# Patient Record
Sex: Male | Born: 1995 | Race: White | Hispanic: No | Marital: Single | Smoking: Never smoker
Health system: Southern US, Community
[De-identification: ages and names within clinical notes are randomized; demographics above are authoritative.]

## PROBLEM LIST (undated history)

## (undated) DIAGNOSIS — F909 Attention-deficit hyperactivity disorder, unspecified type: Secondary | ICD-10-CM

---
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
## 2017-12-20 ENCOUNTER — Other Ambulatory Visit: Payer: Self-pay

## 2017-12-20 ENCOUNTER — Emergency Department (HOSPITAL_COMMUNITY)
Admission: EM | Admit: 2017-12-20 | Discharge: 2017-12-21 | Disposition: A | Discharge status: Home / Self Care | Payer: Self-pay | Attending: Emergency Medicine | Admitting: Emergency Medicine

## 2017-12-20 ENCOUNTER — Encounter (HOSPITAL_COMMUNITY): Payer: Self-pay | Admitting: *Deleted

## 2017-12-20 DIAGNOSIS — B882 Other arthropod infestations: Secondary | ICD-10-CM

## 2017-12-20 DIAGNOSIS — W57XXXA Bitten or stung by nonvenomous insect and other nonvenomous arthropods, initial encounter: Secondary | ICD-10-CM | POA: Insufficient documentation

## 2017-12-20 DIAGNOSIS — Y939 Activity, unspecified: Secondary | ICD-10-CM | POA: Insufficient documentation

## 2017-12-20 DIAGNOSIS — M791 Myalgia, unspecified site: Secondary | ICD-10-CM | POA: Insufficient documentation

## 2017-12-20 DIAGNOSIS — F17290 Nicotine dependence, other tobacco product, uncomplicated: Secondary | ICD-10-CM | POA: Insufficient documentation

## 2017-12-20 DIAGNOSIS — S30863A Insect bite (nonvenomous) of scrotum and testes, initial encounter: Secondary | ICD-10-CM | POA: Insufficient documentation

## 2017-12-20 DIAGNOSIS — S80869A Insect bite (nonvenomous), unspecified lower leg, initial encounter: Secondary | ICD-10-CM | POA: Insufficient documentation

## 2017-12-20 DIAGNOSIS — S40869A Insect bite (nonvenomous) of unspecified upper arm, initial encounter: Secondary | ICD-10-CM | POA: Insufficient documentation

## 2017-12-20 DIAGNOSIS — Y998 Other external cause status: Secondary | ICD-10-CM | POA: Insufficient documentation

## 2017-12-20 DIAGNOSIS — Y929 Unspecified place or not applicable: Secondary | ICD-10-CM | POA: Insufficient documentation

## 2017-12-20 DIAGNOSIS — A681 Tick-borne relapsing fever: Secondary | ICD-10-CM | POA: Insufficient documentation

## 2017-12-20 HISTORY — DX: Attention-deficit hyperactivity disorder, unspecified type: F90.9

## 2017-12-20 NOTE — ED Triage Notes (Signed)
Pt states he has noticed bumps on his genital area for the last 4 months; pt states the bumps come and go; pt states he feels sore all over;

## 2017-12-21 MED ORDER — DOXYCYCLINE HYCLATE 100 MG PO CAPS: 100.0000 mg | ORAL_CAPSULE | Freq: Two times a day (BID) | ORAL | 0 refills | Status: DC

## 2017-12-21 MED ORDER — DOXYCYCLINE HYCLATE 100 MG PO TABS
100.0000 mg | ORAL_TABLET | Freq: Once | ORAL | Status: AC
  Administered 2017-12-21: 100 mg via ORAL
  Filled 2017-12-21: qty 1

## 2017-12-21 NOTE — Discharge Instructions (Addendum)
Take the antibiotics until gone. Your body aches and tick exposure make it likely you are having a tick born illness. You have bug bites on your arms and legs, a few of the lesions on your scrotum look like bug bites. You don't have any obvious lesions to suggest herpes. You can go to the Health department to be tested for STD's.

## 2017-12-21 NOTE — ED Provider Notes (Signed)
[facility] EMERGENCY DEPARTMENT Provider Note   CSN: [id number] Arrival date & time: 12/20/17  2100  Time seen 23:43 PM    History   Chief Complaint Chief Complaint  Patient presents with  . SEXUALLY TRANSMITTED DISEASE    HPI Bobby Weaver is a 22 y.o. male.  HPI patient is here with his sexual partner.  He states he started getting bumps on his genitals 4 months ago.  He states it comes and goes and last about a month.  The lesions are mainly on his scrotum.  He states are painful and sometimes itchy.  Sometimes a drain.  Some are red.  He denies ulcers.  He denies a penile drip or dysuria.  He denies any open sores.  His sexual partner states she does not have any genital lesions.  He states he is never been treated for an STD before.  He then tells me he was [place] in [place] the first time about this a year and a half ago for the same lesions in his groin.  He states he tested negative for STDs at that time.  He states they thought maybe he had genital warts but they were not sure.  Patient states he has had body aches for the past 6 weeks and feels fatigued.  [place] and he has had tick bites.  He also started a new job a few weeks ago and states he works 12 hours a day and he feels very fatigued.  Patient states he works for a [employer], early [employer] company.  PCP Merlyn AlbertLuking, William S, MD   Past Medical History:  Diagnosis Date  . ADHD     There are no active problems to display for this patient.   History reviewed. No pertinent surgical history.      Home Medications    None  Prior to Admission medications   Medication Sig Start Date End Date Taking? Authorizing Provider  doxycycline (VIBRAMYCIN) 100 MG capsule Take 1 capsule (100 mg total) by mouth 2 (two) times daily. 12/21/17   Devoria AlbeKnapp, Angelyna Henderson, MD  famotidine (PEPCID) 20 MG tablet Take 1 tablet (20 mg total) by mouth daily. 11/28/12   Devoria AlbeKnapp, Annistyn Depass, MD  famotidine (PEPCID) 40 MG tablet Take 0.5 tablets  (20 mg total) by mouth 2 (two) times daily. 11/28/12   Devoria AlbeKnapp, Shreena Baines, MD  famotidine (PEPCID) 40 MG tablet Take 1 tablet (40 mg total) by mouth daily. 11/28/12   Devoria AlbeKnapp, Trini Christiansen, MD  predniSONE (DELTASONE) 20 MG tablet Take 3 po QD x 2d starting Wednesday evening, then 2 po QD x 3d then 1 po QD x 3d 11/28/12   Devoria AlbeKnapp, Jolanta Cabeza, MD  predniSONE (DELTASONE) 20 MG tablet Take 3 po QD x 2d starting Wednesday evening, then 2 po QD x 3d then 1 po QD x 3d 11/28/12   Devoria AlbeKnapp, Tel Hevia, MD  predniSONE (DELTASONE) 20 MG tablet Take 3 po QD x 2d starting Wednesday evening, then 2 po QD x 3d then 1 po QD x 3d 11/28/12   Devoria AlbeKnapp, Esa Raden, MD    Family History History reviewed. No pertinent family history.  Social History Social History   Tobacco Use  . Smoking status: Current Some Day Smoker    Packs/day: 0.30  . Smokeless tobacco: Current User    Types: Snuff  Substance Use Topics  . Alcohol use: Yes    Comment: ocassionally  . Drug use: Yes    Types: Marijuana  employed  Allergies   Patient has no known allergies.   Review of Systems Review of Systems  All other systems reviewed and are negative.    Physical Exam Updated Vital Signs BP (!) 112/98 (BP Location: Right Arm)   Pulse 66   Temp 97.7 F (36.5 C) (Oral)   Resp 17   Ht 5\' 9"  (1.753 m)   Wt 65.8 kg (145 lb)   SpO2 99%   BMI 21.41 kg/m   Vital signs normal    Physical Exam  Constitutional: He is oriented to person, place, and time. He appears well-developed and well-nourished. No distress.  HENT:  Head: Normocephalic and atraumatic.  Right Ear: External ear normal.  Left Ear: External ear normal.  Nose: Nose normal.  Eyes: Conjunctivae and EOM are normal.  Neck: Neck supple.  Cardiovascular: Normal rate.  Pulmonary/Chest: Effort normal. No respiratory distress.  Musculoskeletal: Normal range of motion. He exhibits no deformity.  Neurological: He is alert and oriented to person, place, and time. No cranial nerve deficit.  Skin:  Patient is  noted to have scattered lesions on his distal arms and legs that are consistent with bug bites.  He has a few lesions around his waistband.  There are no lesions in the webspaces of his fingers.  When I examine his groin he has 2 or 3 lesions on his scrotum that look like bug bites that are similar to what he has on his arms and legs.  There is one clear fluid-filled blister that is less than 1/2 cm in size.  He has no lesions on his foreskin.  Nursing note and vitals reviewed.    ED Treatments / Results  Labs (all labs ordered are listed, but only abnormal results are displayed) Labs Reviewed - No data to display  EKG None  Radiology No results found.  Procedures Procedures (including critical care time)  Medications Ordered in ED Medications  doxycycline (VIBRA-TABS) tablet 100 mg (100 mg Oral Given 12/21/17 0011)     Initial Impression / Assessment and Plan / ED Course  I have reviewed the triage vital signs and the nursing notes.  Pertinent labs & imaging results that were available during my care of the patient were reviewed by me and considered in my medical decision making (see chart for details).     I talked to the patient about being tested for STDs, he is questioning how much it will cost.  I suggested he try the health department which would be much cheaper than the hospital or possibly even free.  He has elected to do that.  Due to his exposure of tics and his complaints of having fatigue and body aches he was started on doxycycline for 2 weeks for presumed tickborne illness.    Final Clinical Impressions(s) / ED Diagnoses   Final diagnoses:  Myalgia  Tick-borne disease  Insect bite, unspecified site, initial encounter    ED Discharge Orders        Ordered    doxycycline (VIBRAMYCIN) 100 MG capsule  2 times daily     12/21/17 0005     Plan discharge  Devoria Albe, MD, Concha Pyo, MD 12/21/17 [phone]

## 2018-09-19 ENCOUNTER — Other Ambulatory Visit: Payer: Self-pay

## 2018-09-19 ENCOUNTER — Emergency Department (HOSPITAL_COMMUNITY)
Admission: EM | Admit: 2018-09-19 | Discharge: 2018-09-19 | Disposition: A | Discharge status: Home / Self Care | Payer: BLUE CROSS/BLUE SHIELD | Attending: Emergency Medicine | Admitting: Emergency Medicine

## 2018-09-19 ENCOUNTER — Encounter (HOSPITAL_COMMUNITY): Payer: Self-pay | Admitting: Emergency Medicine

## 2018-09-19 ENCOUNTER — Emergency Department (HOSPITAL_COMMUNITY): Payer: BLUE CROSS/BLUE SHIELD

## 2018-09-19 DIAGNOSIS — Z23 Encounter for immunization: Secondary | ICD-10-CM | POA: Diagnosis not present

## 2018-09-19 DIAGNOSIS — Y9289 Other specified places as the place of occurrence of the external cause: Secondary | ICD-10-CM | POA: Insufficient documentation

## 2018-09-19 DIAGNOSIS — T148XXA Other injury of unspecified body region, initial encounter: Secondary | ICD-10-CM

## 2018-09-19 DIAGNOSIS — Y998 Other external cause status: Secondary | ICD-10-CM | POA: Diagnosis not present

## 2018-09-19 DIAGNOSIS — W450XXA Nail entering through skin, initial encounter: Secondary | ICD-10-CM | POA: Insufficient documentation

## 2018-09-19 DIAGNOSIS — Y9301 Activity, walking, marching and hiking: Secondary | ICD-10-CM | POA: Diagnosis not present

## 2018-09-19 DIAGNOSIS — S91331A Puncture wound without foreign body, right foot, initial encounter: Secondary | ICD-10-CM | POA: Diagnosis present

## 2018-09-19 MED ORDER — AMOXICILLIN-POT CLAVULANATE 875-125 MG PO TABS: 1.0000 | ORAL_TABLET | Freq: Two times a day (BID) | ORAL | 0 refills | Status: AC

## 2018-09-19 MED ORDER — TETANUS-DIPHTH-ACELL PERTUSSIS 5-2.5-18.5 LF-MCG/0.5 IM SUSP
0.5000 mL | Freq: Once | INTRAMUSCULAR | Status: AC
  Administered 2018-09-19: 15:00:00 0.5 mL via INTRAMUSCULAR
  Filled 2018-09-19: qty 0.5

## 2018-09-19 NOTE — Discharge Instructions (Addendum)
To help with pain in your right foot, we are prescribing an antibiotic in case you have an infection.  Also it helps to soak the foot in warm water 3-4 times a day for 30 minutes.  When you are not walking, try to elevate your foot above your heart is much as possible, to help the pain.  We sent a prescription to your pharmacy.  Go there and pick it up and start taking it today.  See your doctor or return here as needed for problems.

## 2018-09-19 NOTE — ED Provider Notes (Signed)
[facility] EMERGENCY DEPARTMENT Provider Note   CSN: [id number] Arrival date & time: 09/19/18  1403    History   Chief Complaint Chief Complaint  Patient presents with  . Foot Injury    HPI Bobby Weaver is a 23 y.o. male.     HPI   He presents for evaluation of worsening foot pain after stepping on a nail, while at work, 2 days ago.  He was wearing a shoe at the time.  The pain is worse when ambulating.  He denies fever, chills, nausea, dizziness, weakness.  Unknown prior tetanus status.  There are no other known modifying factors.  Past Medical History:  Diagnosis Date  . ADHD     There are no active problems to display for this patient.   History reviewed. No pertinent surgical history.      Home Medications    Prior to Admission medications   Medication Sig Start Date End Date Taking? Authorizing Provider  amoxicillin-clavulanate (AUGMENTIN) 875-125 MG tablet Take 1 tablet by mouth 2 (two) times daily. One po bid x 7 days 09/19/18   Mancel Bale, MD    Family History History reviewed. No pertinent family history.  Social History Social History   Tobacco Use  . Smoking status: Current Some Day Smoker    Packs/day: 0.30  . Smokeless tobacco: Current User    Types: Snuff  Substance Use Topics  . Alcohol use: Yes    Comment: ocassionally  . Drug use: Yes    Types: Marijuana     Allergies   Patient has no known allergies.   Review of Systems Review of Systems  All other systems reviewed and are negative.    Physical Exam Updated Vital Signs BP 121/75 (BP Location: Right Arm)   Pulse 66   Temp 98.3 F (36.8 C) (Oral)   Resp 17   Ht 5\' 9"  (1.753 m)   Wt 68 kg   SpO2 100%   BMI 22.15 kg/m   Physical Exam Vitals signs and nursing note reviewed.  Constitutional:      Appearance: He is well-developed.  HENT:     Head: Normocephalic and atraumatic.     Right Ear: External ear normal.     Left Ear: External ear normal.  Eyes:     Conjunctiva/sclera: Conjunctivae normal.     Pupils: Pupils are equal, round, and reactive to light.  Neck:     Musculoskeletal: Normal range of motion and neck supple.     Trachea: Phonation normal.  Cardiovascular:     Rate and Rhythm: Normal rate.  Musculoskeletal: Normal range of motion.        General: No swelling.     Comments: Right heel is moderately tender, there is a small puncture, mid aspect of the plantar heel.  There is no associated bleeding, drainage or redness from this wound.  There is no change in skin color.  Skin:    General: Skin is warm and dry.  Neurological:     Mental Status: He is alert and oriented to person, place, and time.     Cranial Nerves: No cranial nerve deficit.     Sensory: No sensory deficit.     Motor: No abnormal muscle tone.     Coordination: Coordination normal.  Psychiatric:        Behavior: Behavior normal.        Thought Content: Thought content normal.        Judgment: Judgment normal.  ED Treatments / Results  Labs (all labs ordered are listed, but only abnormal results are displayed) Labs Reviewed - No data to display  EKG None  Radiology Dg Foot 2 Views Right  Result Date: 09/19/2018 CLINICAL DATA:  Puncture wound. EXAM: RIGHT FOOT - 2 VIEW COMPARISON:  None. FINDINGS: There is no evidence of fracture or dislocation. There is no evidence of arthropathy or other focal bone abnormality. Soft tissues are unremarkable. IMPRESSION: Negative. Electronically Signed   By: Elberta Fortisaniel  Boyle M.D.   On: 09/19/2018 14:53    Procedures Procedures (including critical care time)  Medications Ordered in ED Medications  Tdap (BOOSTRIX) injection 0.5 mL (0.5 mLs Intramuscular Given 09/19/18 1514)     Initial Impression / Assessment and Plan / ED Course  I have reviewed the triage vital signs and the nursing notes.  Pertinent labs & imaging results that were available during my care of the patient were reviewed by me and considered  in my medical decision making (see chart for details).  Clinical Course as of Sep 18 1513  Wed Sep 19, 2018  1504 No foreign body, or fracture, images reviewed by me  DG Foot 2 Views Right [EW]    Clinical Course User Index [EW] Mancel BaleWentz, Orton Capell, MD        Patient Vitals for the past 24 hrs:  BP Temp Temp src Pulse Resp SpO2 Height Weight  09/19/18 1418 - - - - - - 5\' 9"  (1.753 m) 68 kg  09/19/18 1417 121/75 98.3 F (36.8 C) Oral 66 17 100 % - -    3:14 PM Reevaluation with update and discussion. After initial assessment and treatment, an updated evaluation reveals he is ambulating with a limp.  He has no further complaints.  Findings discussed with the patient all questions were answered. Mancel BaleElliott Linnaea Ahn   Medical Decision Making: Puncture wound, with possible early wound infection.  No evidence for foreign body.  No ascending infection.  CRITICAL CARE-no Performed by: Mancel BaleElliott Ayanah Snader  Nursing Notes Reviewed/ Care Coordinated Applicable Imaging Reviewed Interpretation of Laboratory Data incorporated into ED treatment  The patient appears reasonably screened and/or stabilized for discharge and I doubt any other medical condition or other [facility] requiring further screening, evaluation, or treatment in the ED at this time prior to discharge.  Plan: Home Medications-OTC analgesia of choice; Home Treatments-warm soaks and elevation for comfort; return here if the recommended treatment, does not improve the symptoms; Recommended follow up-PCP, PRN   Final Clinical Impressions(s) / ED Diagnoses   Final diagnoses:  Puncture wound    ED Discharge Orders         Ordered    amoxicillin-clavulanate (AUGMENTIN) 875-125 MG tablet  2 times daily     09/19/18 1513           Mancel BaleWentz, Nadiya Pieratt, MD 09/19/18 1515

## 2018-09-19 NOTE — ED Triage Notes (Signed)
Pt stepped on a nail at work 3 days ago.  States having ankle pain with mild swelling since

## 2018-10-06 ENCOUNTER — Other Ambulatory Visit: Payer: Self-pay

## 2018-10-06 ENCOUNTER — Emergency Department (HOSPITAL_COMMUNITY): Payer: BLUE CROSS/BLUE SHIELD

## 2018-10-06 ENCOUNTER — Encounter (HOSPITAL_COMMUNITY): Payer: Self-pay | Admitting: *Deleted

## 2018-10-06 ENCOUNTER — Inpatient Hospital Stay (HOSPITAL_COMMUNITY)
Admission: EM | Admit: 2018-10-06 | Discharge: 2018-10-22 | DRG: 917 | Disposition: E | Payer: BLUE CROSS/BLUE SHIELD | Attending: Pulmonary Disease | Admitting: Pulmonary Disease

## 2018-10-06 DIAGNOSIS — E874 Mixed disorder of acid-base balance: Secondary | ICD-10-CM | POA: Diagnosis present

## 2018-10-06 DIAGNOSIS — J9602 Acute respiratory failure with hypercapnia: Secondary | ICD-10-CM | POA: Diagnosis present

## 2018-10-06 DIAGNOSIS — N179 Acute kidney failure, unspecified: Secondary | ICD-10-CM

## 2018-10-06 DIAGNOSIS — G253 Myoclonus: Secondary | ICD-10-CM | POA: Diagnosis present

## 2018-10-06 DIAGNOSIS — Z978 Presence of other specified devices: Secondary | ICD-10-CM

## 2018-10-06 DIAGNOSIS — Z20828 Contact with and (suspected) exposure to other viral communicable diseases: Secondary | ICD-10-CM | POA: Diagnosis present

## 2018-10-06 DIAGNOSIS — J9601 Acute respiratory failure with hypoxia: Secondary | ICD-10-CM | POA: Diagnosis present

## 2018-10-06 DIAGNOSIS — Y92002 Bathroom of unspecified non-institutional (private) residence single-family (private) house as the place of occurrence of the external cause: Secondary | ICD-10-CM

## 2018-10-06 DIAGNOSIS — G931 Anoxic brain damage, not elsewhere classified: Secondary | ICD-10-CM | POA: Diagnosis present

## 2018-10-06 DIAGNOSIS — A419 Sepsis, unspecified organism: Secondary | ICD-10-CM | POA: Diagnosis present

## 2018-10-06 DIAGNOSIS — J982 Interstitial emphysema: Secondary | ICD-10-CM | POA: Diagnosis not present

## 2018-10-06 DIAGNOSIS — T405X1A Poisoning by cocaine, accidental (unintentional), initial encounter: Principal | ICD-10-CM | POA: Diagnosis present

## 2018-10-06 DIAGNOSIS — T50904A Poisoning by unspecified drugs, medicaments and biological substances, undetermined, initial encounter: Secondary | ICD-10-CM

## 2018-10-06 DIAGNOSIS — K7200 Acute and subacute hepatic failure without coma: Secondary | ICD-10-CM | POA: Diagnosis present

## 2018-10-06 DIAGNOSIS — E872 Acidosis: Secondary | ICD-10-CM

## 2018-10-06 DIAGNOSIS — Z8674 Personal history of sudden cardiac arrest: Secondary | ICD-10-CM

## 2018-10-06 DIAGNOSIS — Z529 Donor of unspecified organ or tissue: Secondary | ICD-10-CM

## 2018-10-06 DIAGNOSIS — G935 Compression of brain: Secondary | ICD-10-CM | POA: Diagnosis present

## 2018-10-06 DIAGNOSIS — R6521 Severe sepsis with septic shock: Secondary | ICD-10-CM | POA: Diagnosis present

## 2018-10-06 DIAGNOSIS — R570 Cardiogenic shock: Secondary | ICD-10-CM | POA: Diagnosis present

## 2018-10-06 DIAGNOSIS — F1210 Cannabis abuse, uncomplicated: Secondary | ICD-10-CM | POA: Diagnosis present

## 2018-10-06 DIAGNOSIS — Z515 Encounter for palliative care: Secondary | ICD-10-CM

## 2018-10-06 DIAGNOSIS — I469 Cardiac arrest, cause unspecified: Secondary | ICD-10-CM

## 2018-10-06 DIAGNOSIS — G936 Cerebral edema: Secondary | ICD-10-CM | POA: Diagnosis present

## 2018-10-06 DIAGNOSIS — F1110 Opioid abuse, uncomplicated: Secondary | ICD-10-CM | POA: Diagnosis present

## 2018-10-06 DIAGNOSIS — F1510 Other stimulant abuse, uncomplicated: Secondary | ICD-10-CM | POA: Diagnosis present

## 2018-10-06 DIAGNOSIS — T50901A Poisoning by unspecified drugs, medicaments and biological substances, accidental (unintentional), initial encounter: Secondary | ICD-10-CM | POA: Diagnosis present

## 2018-10-06 DIAGNOSIS — G9382 Brain death: Secondary | ICD-10-CM | POA: Diagnosis not present

## 2018-10-06 DIAGNOSIS — Z7189 Other specified counseling: Secondary | ICD-10-CM

## 2018-10-06 DIAGNOSIS — G92 Toxic encephalopathy: Secondary | ICD-10-CM | POA: Diagnosis present

## 2018-10-06 DIAGNOSIS — I609 Nontraumatic subarachnoid hemorrhage, unspecified: Secondary | ICD-10-CM | POA: Diagnosis present

## 2018-10-06 DIAGNOSIS — N170 Acute kidney failure with tubular necrosis: Secondary | ICD-10-CM | POA: Diagnosis present

## 2018-10-06 DIAGNOSIS — J690 Pneumonitis due to inhalation of food and vomit: Secondary | ICD-10-CM | POA: Diagnosis present

## 2018-10-06 DIAGNOSIS — F1410 Cocaine abuse, uncomplicated: Secondary | ICD-10-CM | POA: Diagnosis present

## 2018-10-06 DIAGNOSIS — T407X1A Poisoning by cannabis (derivatives), accidental (unintentional), initial encounter: Secondary | ICD-10-CM | POA: Diagnosis present

## 2018-10-06 LAB — RAPID URINE DRUG SCREEN, HOSP PERFORMED
Amphetamines: POSITIVE — AB
Barbiturates: NOT DETECTED
Benzodiazepines: NOT DETECTED
Cocaine: POSITIVE — AB
Opiates: NOT DETECTED
Tetrahydrocannabinol: POSITIVE — AB

## 2018-10-06 LAB — COMPREHENSIVE METABOLIC PANEL
ALT: 116 U/L — ABNORMAL HIGH (ref 0–44)
AST: 145 U/L — ABNORMAL HIGH (ref 15–41)
Albumin: 3.1 g/dL — ABNORMAL LOW (ref 3.5–5.0)
Alkaline Phosphatase: 64 U/L (ref 38–126)
Anion gap: 19 — ABNORMAL HIGH (ref 5–15)
BUN: 13 mg/dL (ref 6–20)
CO2: 16 mmol/L — ABNORMAL LOW (ref 22–32)
Calcium: 8.2 mg/dL — ABNORMAL LOW (ref 8.9–10.3)
Chloride: 107 mmol/L (ref 98–111)
Creatinine, Ser: 2.24 mg/dL — ABNORMAL HIGH (ref 0.61–1.24)
GFR calc Af Amer: 47 mL/min — ABNORMAL LOW (ref >60)
GFR calc non Af Amer: 40 mL/min — ABNORMAL LOW (ref >60)
Glucose, Bld: 347 mg/dL — ABNORMAL HIGH (ref 70–99)
Potassium: 5.2 mmol/L — ABNORMAL HIGH (ref 3.5–5.1)
Sodium: 142 mmol/L (ref 135–145)
Total Bilirubin: 0.7 mg/dL (ref 0.3–1.2)
Total Protein: 5.4 g/dL — ABNORMAL LOW (ref 6.5–8.1)

## 2018-10-06 LAB — POCT I-STAT 7, (LYTES, BLD GAS, ICA,H+H)
Acid-base deficit: 11 mmol/L — ABNORMAL HIGH (ref 0.0–2.0)
Bicarbonate: 19.7 mmol/L — ABNORMAL LOW (ref 20.0–28.0)
Calcium, Ion: 1.08 mmol/L — ABNORMAL LOW (ref 1.15–1.40)
HCT: 41 % (ref 39.0–52.0)
Hemoglobin: 13.9 g/dL (ref 13.0–17.0)
O2 Saturation: 100 %
Potassium: 6 mmol/L — ABNORMAL HIGH (ref 3.5–5.1)
Sodium: 138 mmol/L (ref 135–145)
TCO2: 22 mmol/L (ref 22–32)
pCO2 arterial: 67.1 mmHg (ref 32.0–48.0)
pH, Arterial: 7.075 — CL (ref 7.350–7.450)
pO2, Arterial: 578 mmHg — ABNORMAL HIGH (ref 83.0–108.0)

## 2018-10-06 LAB — POCT I-STAT EG7
Acid-base deficit: 17 mmol/L — ABNORMAL HIGH (ref 0.0–2.0)
Bicarbonate: 15.3 mmol/L — ABNORMAL LOW (ref 20.0–28.0)
Calcium, Ion: 1.02 mmol/L — ABNORMAL LOW (ref 1.15–1.40)
HCT: 39 % (ref 39.0–52.0)
Hemoglobin: 13.3 g/dL (ref 13.0–17.0)
O2 Saturation: 98 %
Potassium: 4.9 mmol/L (ref 3.5–5.1)
Sodium: 138 mmol/L (ref 135–145)
TCO2: 17 mmol/L — ABNORMAL LOW (ref 22–32)
pCO2, Ven: 69.6 mmHg — ABNORMAL HIGH (ref 44.0–60.0)
pH, Ven: 6.951 — CL (ref 7.250–7.430)
pO2, Ven: 170 mmHg — ABNORMAL HIGH (ref 32.0–45.0)

## 2018-10-06 LAB — SALICYLATE LEVEL: Salicylate Lvl: <7 mg/dL (ref 2.8–30.0)

## 2018-10-06 LAB — ACETAMINOPHEN LEVEL: Acetaminophen (Tylenol), Serum: <10 ug/mL — ABNORMAL LOW (ref 10–30)

## 2018-10-06 LAB — ETHANOL: Alcohol, Ethyl (B): <10 mg/dL (ref <10)

## 2018-10-06 LAB — LACTIC ACID, PLASMA: Lactic Acid, Venous: 9 mmol/L (ref 0.5–1.9)

## 2018-10-06 MED ORDER — FENTANYL 2500MCG IN NS 250ML (10MCG/ML) PREMIX INFUSION
40.0000 ug/h | INTRAVENOUS | Status: DC
  Administered 2018-10-07: 40 ug/h via INTRAVENOUS
  Filled 2018-10-06: qty 250

## 2018-10-06 MED ORDER — SODIUM CHLORIDE 0.9 % IV BOLUS
1000.0000 mL | Freq: Once | INTRAVENOUS | Status: AC
  Administered 2018-10-06: 1000 mL via INTRAVENOUS

## 2018-10-06 MED ORDER — FENTANYL CITRATE (PF) 100 MCG/2ML IJ SOLN
100.0000 ug | Freq: Once | INTRAMUSCULAR | Status: AC
  Administered 2018-10-06: 100 ug via INTRAVENOUS
  Filled 2018-10-06: qty 2

## 2018-10-06 NOTE — Progress Notes (Signed)
   10-19-18 2246  Clinical Encounter Type  Visited With Health care provider  Visit Type Initial  Referral From Nurse   Chaplain was paged for patient who is post-CPR. Family is in Consultation A. Chaplain was advised by RN to be on standby until the MD is ready to update the family. Chaplain will seek to follow-up.   Spiritual care services available as needed.   Chaplain Dalbert Garnet

## 2018-10-06 NOTE — ED Notes (Signed)
bair hugger applied.

## 2018-10-06 NOTE — ED Notes (Signed)
bair hugger placed on the pt as soon as he arrived after the rectal temp

## 2018-10-06 NOTE — ED Notes (Signed)
Pts family members in consultation room A

## 2018-10-06 NOTE — ED Triage Notes (Signed)
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

## 2018-10-06 NOTE — ED Notes (Signed)
To c-t and back  He has some head motion both pupils remains fixed and dilated

## 2018-10-06 NOTE — Progress Notes (Signed)
ABG collected  

## 2018-10-06 NOTE — Progress Notes (Signed)
Patient transported from trauma A to CT and back with no complications.

## 2018-10-07 ENCOUNTER — Inpatient Hospital Stay (HOSPITAL_COMMUNITY): Payer: BLUE CROSS/BLUE SHIELD

## 2018-10-07 ENCOUNTER — Inpatient Hospital Stay: Payer: Self-pay

## 2018-10-07 DIAGNOSIS — E872 Acidosis, unspecified: Secondary | ICD-10-CM | POA: Insufficient documentation

## 2018-10-07 DIAGNOSIS — G931 Anoxic brain damage, not elsewhere classified: Secondary | ICD-10-CM

## 2018-10-07 DIAGNOSIS — J9602 Acute respiratory failure with hypercapnia: Secondary | ICD-10-CM

## 2018-10-07 DIAGNOSIS — Z978 Presence of other specified devices: Secondary | ICD-10-CM | POA: Insufficient documentation

## 2018-10-07 DIAGNOSIS — Z515 Encounter for palliative care: Secondary | ICD-10-CM

## 2018-10-07 DIAGNOSIS — G9340 Encephalopathy, unspecified: Secondary | ICD-10-CM

## 2018-10-07 DIAGNOSIS — J9601 Acute respiratory failure with hypoxia: Secondary | ICD-10-CM

## 2018-10-07 DIAGNOSIS — T50901A Poisoning by unspecified drugs, medicaments and biological substances, accidental (unintentional), initial encounter: Secondary | ICD-10-CM | POA: Diagnosis present

## 2018-10-07 DIAGNOSIS — I469 Cardiac arrest, cause unspecified: Secondary | ICD-10-CM | POA: Insufficient documentation

## 2018-10-07 LAB — BASIC METABOLIC PANEL
Anion gap: 22 — ABNORMAL HIGH (ref 5–15)
Anion gap: 19 — ABNORMAL HIGH (ref 5–15)
Anion gap: 15 (ref 5–15)
BUN: 20 mg/dL (ref 6–20)
BUN: 23 mg/dL — ABNORMAL HIGH (ref 6–20)
BUN: 31 mg/dL — ABNORMAL HIGH (ref 6–20)
CO2: 21 mmol/L — ABNORMAL LOW (ref 22–32)
CO2: 26 mmol/L (ref 22–32)
CO2: 31 mmol/L (ref 22–32)
Calcium: 7.6 mg/dL — ABNORMAL LOW (ref 8.9–10.3)
Calcium: 7.4 mg/dL — ABNORMAL LOW (ref 8.9–10.3)
Calcium: 6 mg/dL — CL (ref 8.9–10.3)
Chloride: 108 mmol/L (ref 98–111)
Chloride: 105 mmol/L (ref 98–111)
Chloride: 100 mmol/L (ref 98–111)
Creatinine, Ser: 2.61 mg/dL — ABNORMAL HIGH (ref 0.61–1.24)
Creatinine, Ser: 2.56 mg/dL — ABNORMAL HIGH (ref 0.61–1.24)
Creatinine, Ser: 2.64 mg/dL — ABNORMAL HIGH (ref 0.61–1.24)
GFR calc Af Amer: 39 mL/min — ABNORMAL LOW (ref >60)
GFR calc Af Amer: 40 mL/min — ABNORMAL LOW (ref >60)
GFR calc Af Amer: 38 mL/min — ABNORMAL LOW (ref >60)
GFR calc non Af Amer: 34 mL/min — ABNORMAL LOW (ref >60)
GFR calc non Af Amer: 34 mL/min — ABNORMAL LOW (ref >60)
GFR calc non Af Amer: 33 mL/min — ABNORMAL LOW (ref >60)
Glucose, Bld: 101 mg/dL — ABNORMAL HIGH (ref 70–99)
Glucose, Bld: 98 mg/dL (ref 70–99)
Glucose, Bld: 105 mg/dL — ABNORMAL HIGH (ref 70–99)
Potassium: 4.4 mmol/L (ref 3.5–5.1)
Potassium: 3.8 mmol/L (ref 3.5–5.1)
Potassium: 4.5 mmol/L (ref 3.5–5.1)
Sodium: 151 mmol/L — ABNORMAL HIGH (ref 135–145)
Sodium: 150 mmol/L — ABNORMAL HIGH (ref 135–145)
Sodium: 146 mmol/L — ABNORMAL HIGH (ref 135–145)

## 2018-10-07 LAB — CBC WITH DIFFERENTIAL/PLATELET
Abs Immature Granulocytes: 0.72 10*3/uL — ABNORMAL HIGH (ref 0.00–0.07)
Abs Immature Granulocytes: 0.05 10*3/uL (ref 0.00–0.07)
Basophils Absolute: 0.1 10*3/uL (ref 0.0–0.1)
Basophils Absolute: 0.1 10*3/uL (ref 0.0–0.1)
Basophils Relative: 1 %
Basophils Relative: 1 %
Eosinophils Absolute: 0.2 10*3/uL (ref 0.0–0.5)
Eosinophils Absolute: 0 10*3/uL (ref 0.0–0.5)
Eosinophils Relative: 2 %
Eosinophils Relative: 0 %
HCT: 42.8 % (ref 39.0–52.0)
HCT: 59.4 % — ABNORMAL HIGH (ref 39.0–52.0)
Hemoglobin: 13.3 g/dL (ref 13.0–17.0)
Hemoglobin: 19.3 g/dL — ABNORMAL HIGH (ref 13.0–17.0)
Immature Granulocytes: 5 %
Immature Granulocytes: 1 %
Lymphocytes Relative: 49 %
Lymphocytes Relative: 12 %
Lymphs Abs: 7.2 10*3/uL — ABNORMAL HIGH (ref 0.7–4.0)
Lymphs Abs: 0.8 10*3/uL (ref 0.7–4.0)
MCH: 30.7 pg (ref 26.0–34.0)
MCH: 30.7 pg (ref 26.0–34.0)
MCHC: 31.1 g/dL (ref 30.0–36.0)
MCHC: 32.5 g/dL (ref 30.0–36.0)
MCV: 98.8 fL (ref 80.0–100.0)
MCV: 94.6 fL (ref 80.0–100.0)
Monocytes Absolute: 0.7 10*3/uL (ref 0.1–1.0)
Monocytes Absolute: 0.9 10*3/uL (ref 0.1–1.0)
Monocytes Relative: 5 %
Monocytes Relative: 13 %
Neutro Abs: 5.6 10*3/uL (ref 1.7–7.7)
Neutro Abs: 4.7 10*3/uL (ref 1.7–7.7)
Neutrophils Relative %: 38 %
Neutrophils Relative %: 73 %
Platelets: 377 10*3/uL (ref 150–400)
Platelets: 273 10*3/uL (ref 150–400)
RBC: 4.33 MIL/uL (ref 4.22–5.81)
RBC: 6.28 MIL/uL — ABNORMAL HIGH (ref 4.22–5.81)
RDW: 12.6 % (ref 11.5–15.5)
RDW: 12.9 % (ref 11.5–15.5)
WBC: 14.5 10*3/uL — ABNORMAL HIGH (ref 4.0–10.5)
WBC: 6.5 10*3/uL (ref 4.0–10.5)
nRBC: 0 % (ref 0.0–0.2)
nRBC: 0 % (ref 0.0–0.2)

## 2018-10-07 LAB — POCT I-STAT 7, (LYTES, BLD GAS, ICA,H+H)
Acid-base deficit: 16 mmol/L — ABNORMAL HIGH (ref 0.0–2.0)
Acid-base deficit: 16 mmol/L — ABNORMAL HIGH (ref 0.0–2.0)
Acid-base deficit: 16 mmol/L — ABNORMAL HIGH (ref 0.0–2.0)
Bicarbonate: 17.8 mmol/L — ABNORMAL LOW (ref 20.0–28.0)
Bicarbonate: 14.3 mmol/L — ABNORMAL LOW (ref 20.0–28.0)
Bicarbonate: 18.4 mmol/L — ABNORMAL LOW (ref 20.0–28.0)
Calcium, Ion: 1.02 mmol/L — ABNORMAL LOW (ref 1.15–1.40)
Calcium, Ion: 1.04 mmol/L — ABNORMAL LOW (ref 1.15–1.40)
Calcium, Ion: 1.07 mmol/L — ABNORMAL LOW (ref 1.15–1.40)
HCT: 60 % — ABNORMAL HIGH (ref 39.0–52.0)
HCT: 59 % — ABNORMAL HIGH (ref 39.0–52.0)
HCT: 60 % — ABNORMAL HIGH (ref 39.0–52.0)
Hemoglobin: 20.4 g/dL — ABNORMAL HIGH (ref 13.0–17.0)
Hemoglobin: 20.1 g/dL — ABNORMAL HIGH (ref 13.0–17.0)
Hemoglobin: 20.4 g/dL — ABNORMAL HIGH (ref 13.0–17.0)
O2 Saturation: 76 %
O2 Saturation: 77 %
O2 Saturation: 86 %
Patient temperature: 96.9
Patient temperature: 96.9
Patient temperature: 96.9
Potassium: 4.7 mmol/L (ref 3.5–5.1)
Potassium: 4.8 mmol/L (ref 3.5–5.1)
Potassium: 5 mmol/L (ref 3.5–5.1)
Sodium: 142 mmol/L (ref 135–145)
Sodium: 140 mmol/L (ref 135–145)
Sodium: 141 mmol/L (ref 135–145)
TCO2: 20 mmol/L — ABNORMAL LOW (ref 22–32)
TCO2: 16 mmol/L — ABNORMAL LOW (ref 22–32)
TCO2: 21 mmol/L — ABNORMAL LOW (ref 22–32)
pCO2 arterial: 72.6 mmHg (ref 32.0–48.0)
pCO2 arterial: 46.7 mmHg (ref 32.0–48.0)
pCO2 arterial: 76.5 mmHg (ref 32.0–48.0)
pH, Arterial: 6.992 — CL (ref 7.350–7.450)
pH, Arterial: 6.982 — CL (ref 7.350–7.450)
pH, Arterial: 7.088 — CL (ref 7.350–7.450)
pO2, Arterial: 59 mmHg — ABNORMAL LOW (ref 83.0–108.0)
pO2, Arterial: 61 mmHg — ABNORMAL LOW (ref 83.0–108.0)
pO2, Arterial: 67 mmHg — ABNORMAL LOW (ref 83.0–108.0)

## 2018-10-07 LAB — SARS CORONAVIRUS 2 BY RT PCR (HOSPITAL ORDER, PERFORMED IN ~~LOC~~ HOSPITAL LAB): SARS Coronavirus 2: NEGATIVE

## 2018-10-07 LAB — GLUCOSE, CAPILLARY
Glucose-Capillary: 117 mg/dL — ABNORMAL HIGH (ref 70–99)
Glucose-Capillary: 105 mg/dL — ABNORMAL HIGH (ref 70–99)
Glucose-Capillary: 117 mg/dL — ABNORMAL HIGH (ref 70–99)
Glucose-Capillary: 89 mg/dL (ref 70–99)
Glucose-Capillary: 79 mg/dL (ref 70–99)

## 2018-10-07 LAB — COMPREHENSIVE METABOLIC PANEL
ALT: 156 U/L — ABNORMAL HIGH (ref 0–44)
AST: 213 U/L — ABNORMAL HIGH (ref 15–41)
Albumin: 3.3 g/dL — ABNORMAL LOW (ref 3.5–5.0)
Alkaline Phosphatase: 119 U/L (ref 38–126)
Anion gap: 16 — ABNORMAL HIGH (ref 5–15)
BUN: 18 mg/dL (ref 6–20)
CO2: 20 mmol/L — ABNORMAL LOW (ref 22–32)
Calcium: 7.7 mg/dL — ABNORMAL LOW (ref 8.9–10.3)
Chloride: 107 mmol/L (ref 98–111)
Creatinine, Ser: 2.92 mg/dL — ABNORMAL HIGH (ref 0.61–1.24)
GFR calc Af Amer: 34 mL/min — ABNORMAL LOW (ref >60)
GFR calc non Af Amer: 29 mL/min — ABNORMAL LOW (ref >60)
Glucose, Bld: 199 mg/dL — ABNORMAL HIGH (ref 70–99)
Potassium: 5.1 mmol/L (ref 3.5–5.1)
Sodium: 143 mmol/L (ref 135–145)
Total Bilirubin: 0.6 mg/dL (ref 0.3–1.2)
Total Protein: 6.1 g/dL — ABNORMAL LOW (ref 6.5–8.1)

## 2018-10-07 LAB — BLOOD GAS, ARTERIAL
Acid-Base Excess: 0.3 mmol/L (ref 0.0–2.0)
Acid-Base Excess: 5.9 mmol/L — ABNORMAL HIGH (ref 0.0–2.0)
Acid-base deficit: 10.7 mmol/L — ABNORMAL HIGH (ref 0.0–2.0)
Bicarbonate: 18.4 mmol/L — ABNORMAL LOW (ref 20.0–28.0)
Bicarbonate: 27.4 mmol/L (ref 20.0–28.0)
Bicarbonate: 33.5 mmol/L — ABNORMAL HIGH (ref 20.0–28.0)
Drawn by: 36277
Drawn by: 44137
Drawn by: 36277
FIO2: 100
FIO2: 100
FIO2: 100
MECHVT: 540 mL
MECHVT: 600 mL
MECHVT: 600 mL
O2 Saturation: 97.3 %
O2 Saturation: 96.8 %
O2 Saturation: 99.1 %
PEEP: 15 cmH2O
PEEP: 15 cmH2O
PEEP: 15 cmH2O
Patient temperature: 102.6
Patient temperature: 98.6
Patient temperature: 101.1
RATE: 28 resp/min
RATE: 30 resp/min
RATE: 30 resp/min
pCO2 arterial: 78.9 mmHg (ref 32.0–48.0)
pCO2 arterial: 71.3 mmHg (ref 32.0–48.0)
pCO2 arterial: 95.5 mmHg (ref 32.0–48.0)
pH, Arterial: 7.015 — CL (ref 7.350–7.450)
pH, Arterial: 7.209 — ABNORMAL LOW (ref 7.350–7.450)
pH, Arterial: 7.181 — CL (ref 7.350–7.450)
pO2, Arterial: 155 mmHg — ABNORMAL HIGH (ref 83.0–108.0)
pO2, Arterial: 98.9 mmHg (ref 83.0–108.0)
pO2, Arterial: 356 mmHg — ABNORMAL HIGH (ref 83.0–108.0)

## 2018-10-07 LAB — URINALYSIS, ROUTINE W REFLEX MICROSCOPIC
Bilirubin Urine: NEGATIVE
Glucose, UA: >=500 mg/dL — AB
Ketones, ur: NEGATIVE mg/dL
Leukocytes,Ua: NEGATIVE
Nitrite: NEGATIVE
Protein, ur: 100 mg/dL — AB
Specific Gravity, Urine: 1.013 (ref 1.005–1.030)
pH: 6 (ref 5.0–8.0)

## 2018-10-07 LAB — PHOSPHORUS
Phosphorus: >30 mg/dL — ABNORMAL HIGH (ref 2.5–4.6)
Phosphorus: 9 mg/dL — ABNORMAL HIGH (ref 2.5–4.6)

## 2018-10-07 LAB — MAGNESIUM
Magnesium: 2.8 mg/dL — ABNORMAL HIGH (ref 1.7–2.4)
Magnesium: 2.4 mg/dL (ref 1.7–2.4)

## 2018-10-07 LAB — MRSA PCR SCREENING: MRSA by PCR: NEGATIVE

## 2018-10-07 LAB — HIV ANTIBODY (ROUTINE TESTING W REFLEX): HIV Screen 4th Generation wRfx: NONREACTIVE

## 2018-10-07 LAB — TROPONIN I
Troponin I: 1.04 ng/mL (ref <0.03)
Troponin I: 0.9 ng/mL (ref <0.03)

## 2018-10-07 LAB — CORTISOL: Cortisol, Plasma: 45.3 ug/dL

## 2018-10-07 LAB — LACTIC ACID, PLASMA: Lactic Acid, Venous: 4.7 mmol/L (ref 0.5–1.9)

## 2018-10-07 LAB — CK: Total CK: 574 U/L — ABNORMAL HIGH (ref 49–397)

## 2018-10-07 MED ORDER — ONDANSETRON HCL 4 MG/2ML IJ SOLN: 4.0000 mg | Freq: Four times a day (QID) | INTRAMUSCULAR | Status: DC | PRN

## 2018-10-07 MED ORDER — CHLORHEXIDINE GLUCONATE CLOTH 2 % EX PADS
6.0000 | MEDICATED_PAD | Freq: Every day | CUTANEOUS | Status: DC
  Administered 2018-10-07 – 2018-10-09 (×3): 6 via TOPICAL

## 2018-10-07 MED ORDER — SODIUM CHLORIDE 0.9 % IV SOLN: INTRAVENOUS | Status: DC | PRN

## 2018-10-07 MED ORDER — ACETAMINOPHEN 325 MG PO TABS
650.0000 mg | ORAL_TABLET | ORAL | Status: DC | PRN
  Administered 2018-10-07: 650 mg via ORAL
  Filled 2018-10-07: qty 2

## 2018-10-07 MED ORDER — ACETAMINOPHEN 650 MG RE SUPP
650.0000 mg | Freq: Once | RECTAL | Status: AC
  Administered 2018-10-07: 650 mg via RECTAL
  Filled 2018-10-07: qty 1

## 2018-10-07 MED ORDER — SODIUM BICARBONATE 8.4 % IV SOLN
INTRAVENOUS | Status: AC
  Filled 2018-10-07: qty 100

## 2018-10-07 MED ORDER — SODIUM CHLORIDE 0.9 % IV SOLN
3.0000 g | Freq: Four times a day (QID) | INTRAVENOUS | Status: DC
  Administered 2018-10-07 – 2018-10-09 (×8): 3 g via INTRAVENOUS
  Filled 2018-10-07 (×8): qty 3

## 2018-10-07 MED ORDER — SODIUM CHLORIDE 0.9 % IV SOLN: 250.0000 mL | INTRAVENOUS | Status: DC

## 2018-10-07 MED ORDER — LACTATED RINGERS IV BOLUS
1000.0000 mL | Freq: Once | INTRAVENOUS | Status: AC
  Administered 2018-10-07: 1000 mL via INTRAVENOUS

## 2018-10-07 MED ORDER — FAMOTIDINE 20 MG PO TABS
20.0000 mg | ORAL_TABLET | Freq: Two times a day (BID) | ORAL | Status: DC
  Administered 2018-10-07 (×2): 20 mg via ORAL
  Filled 2018-10-07 (×2): qty 1

## 2018-10-07 MED ORDER — SODIUM BICARBONATE 8.4 % IV SOLN
200.0000 meq | Freq: Once | INTRAVENOUS | Status: AC
  Administered 2018-10-07: 200 meq via INTRAVENOUS

## 2018-10-07 MED ORDER — SODIUM CHLORIDE 0.9% FLUSH
10.0000 mL | Freq: Two times a day (BID) | INTRAVENOUS | Status: DC
  Administered 2018-10-08 – 2018-10-09 (×3): 10 mL

## 2018-10-07 MED ORDER — SODIUM CHLORIDE 0.9% FLUSH: 10.0000 mL | INTRAVENOUS | Status: DC | PRN

## 2018-10-07 MED ORDER — GABAPENTIN 250 MG/5ML PO SOLN
100.0000 mg | Freq: Three times a day (TID) | ORAL | Status: DC
  Administered 2018-10-07 – 2018-10-08 (×3): 100 mg
  Filled 2018-10-07 (×5): qty 2

## 2018-10-07 MED ORDER — QUETIAPINE FUMARATE 50 MG PO TABS
50.0000 mg | ORAL_TABLET | Freq: Two times a day (BID) | ORAL | Status: DC
  Administered 2018-10-07: 50 mg
  Filled 2018-10-07: qty 1

## 2018-10-07 MED ORDER — DOCUSATE SODIUM 50 MG/5ML PO LIQD
100.0000 mg | Freq: Two times a day (BID) | ORAL | Status: DC | PRN
  Filled 2018-10-07: qty 10

## 2018-10-07 MED ORDER — CALCIUM GLUCONATE-NACL 1-0.675 GM/50ML-% IV SOLN
1.0000 g | Freq: Once | INTRAVENOUS | Status: AC
  Administered 2018-10-07: 1000 mg via INTRAVENOUS
  Filled 2018-10-07: qty 50

## 2018-10-07 MED ORDER — THIAMINE HCL 100 MG/ML IJ SOLN
100.0000 mg | Freq: Every day | INTRAMUSCULAR | Status: DC
  Administered 2018-10-07: 100 mg via INTRAVENOUS
  Filled 2018-10-07: qty 2

## 2018-10-07 MED ORDER — NOREPINEPHRINE 4 MG/250ML-% IV SOLN
2.0000 ug/min | INTRAVENOUS | Status: DC
  Administered 2018-10-07: 2 ug/min via INTRAVENOUS
  Administered 2018-10-08: 6 ug/min via INTRAVENOUS
  Filled 2018-10-07 (×3): qty 250

## 2018-10-07 MED ORDER — CHLORHEXIDINE GLUCONATE CLOTH 2 % EX PADS: 6.0000 | MEDICATED_PAD | Freq: Every day | CUTANEOUS | Status: DC

## 2018-10-07 MED ORDER — HEPARIN SODIUM (PORCINE) 5000 UNIT/ML IJ SOLN
5000.0000 [IU] | Freq: Three times a day (TID) | INTRAMUSCULAR | Status: DC
  Administered 2018-10-07 – 2018-10-08 (×3): 5000 [IU] via SUBCUTANEOUS
  Filled 2018-10-07 (×3): qty 1

## 2018-10-07 MED ORDER — SODIUM BICARBONATE 8.4 % IV SOLN
INTRAVENOUS | Status: DC
  Administered 2018-10-07 – 2018-10-08 (×3): via INTRAVENOUS
  Filled 2018-10-07 (×7): qty 150

## 2018-10-07 MED ORDER — GABAPENTIN 250 MG/5ML PO SOLN
100.0000 mg | Freq: Three times a day (TID) | ORAL | Status: DC
  Filled 2018-10-07 (×2): qty 2

## 2018-10-07 MED ORDER — FOLIC ACID 5 MG/ML IJ SOLN
1.0000 mg | Freq: Every day | INTRAMUSCULAR | Status: DC
  Administered 2018-10-07: 1 mg via INTRAVENOUS
  Filled 2018-10-07 (×2): qty 0.2

## 2018-10-07 MED ORDER — MIDAZOLAM HCL 2 MG/2ML IJ SOLN
INTRAMUSCULAR | Status: AC
  Filled 2018-10-07: qty 2

## 2018-10-07 MED ORDER — SODIUM BICARBONATE 8.4 % IV SOLN
INTRAVENOUS | Status: AC
  Administered 2018-10-07: 200 meq via INTRAVENOUS
  Filled 2018-10-07: qty 100

## 2018-10-07 MED ORDER — MIDAZOLAM HCL 2 MG/2ML IJ SOLN: 2.0000 mg | INTRAMUSCULAR | Status: DC | PRN

## 2018-10-07 MED ORDER — MIDAZOLAM HCL 2 MG/2ML IJ SOLN
2.0000 mg | INTRAMUSCULAR | Status: DC | PRN
  Administered 2018-10-07: 2 mg via INTRAVENOUS

## 2018-10-07 NOTE — ED Notes (Signed)
Pt family has gone home for the night  Call if needed

## 2018-10-07 NOTE — Progress Notes (Signed)
Pt transported to 53M 13 without event.  RT will continue to monitor.

## 2018-10-07 NOTE — Consult Note (Signed)
Consultation Note Date: 10/07/2018   Patient Name: Bobby Weaver  DOB: 1996-02-14  MRN: [id number]  Age / Sex: 23 y.o., male  PCP: Patient, No Pcp Per Referring Physician: Carin Hock, MD  Reason for Consultation: Establishing goals of care  HPI/Patient Profile: 23 y.o. male  admitted on 08-Oct-2018    Clinical Assessment and Goals of Care: 23 year old young gentleman with history of IV drug use.  Reportedly found down for unknown time by family, CPR initiated in the field, patient was admitted to critical care service at [facility].  He is being followed by neurology.  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  He has acute kidney injury fever and worsening hypoxia.  A palliative consultation has also been requested.  Chart reviewed, patient seen and examined.  Discussed with critical care MD.  Discussed with bedside nursing.  Patient essentially unresponsive at this time.  Agree that prognosis appears grim.  We hope to establish contact with the family and hope to establish trust relationship and attempt to be an extra layer of support and to answer any questions they might have.  It is noted that they might have had extensive discussions with several members of the medical staff already today.  Please note additional thoughts below.  NEXT OF KIN Patient's family according to chart review includes parents, grandmothers, his girlfriend who is reported to be pregnant, siblings.  SUMMARY OF RECOMMENDATIONS   Chart reviewed, patient seen and examined. PMT to initiate contact with family on 09/28/2018 to continue overall goals of care discussions. Discussed with PCCM MD.  Thank you for the consult.   Code Status/Advance Care Planning:  Full code    Symptom Management:    as above   Palliative Prophylaxis:   Delirium Protocol  Additional Recommendations  (Limitations, Scope, Preferences):  Full Scope Treatment  Psycho-social/Spiritual:   Desire for further Chaplaincy support:yes  Additional Recommendations: Caregiving  Support/Resources  Prognosis:   Unable to determine  Discharge Planning: To Be Determined      Primary Diagnoses: Present on Admission: . Overdose   I have reviewed the medical record, interviewed the patient and family, and examined the patient. The following aspects are pertinent.  History reviewed. No pertinent past medical history. Social History   Socioeconomic History  . Marital status: Single    Spouse name: Not on file  . Number of children: Not on file  . Years of education: Not on file  . Highest education level: Not on file  Occupational History  . Not on file  Social Needs  . Financial resource strain: Not on file  . Food insecurity:    Worry: Not on file    Inability: Not on file  . Transportation needs:    Medical: Not on file    Non-medical: Not on file  Tobacco Use  . Smoking status: Never Smoker  . Smokeless tobacco: Never Used  Substance and Sexual Activity  . Alcohol use: Not on file  . Drug use: Yes  Types: IV  . Sexual activity: Not on file  Lifestyle  . Physical activity:    Days per week: Not on file    Minutes per session: Not on file  . Stress: Not on file  Relationships  . Social connections:    Talks on phone: Not on file    Gets together: Not on file    Attends religious service: Not on file    Active member of club or organization: Not on file    Attends meetings of clubs or organizations: Not on file    Relationship status: Not on file  Other Topics Concern  . Not on file  Social History Narrative  . Not on file   No family history on file. Scheduled Meds: . Chlorhexidine Gluconate Cloth  6 each Topical Q0600  . famotidine  20 mg Oral BID  . folic acid  1 mg Intravenous Daily  . gabapentin  100 mg Per Tube Q8H  . heparin  5,000 Units  Subcutaneous Q8H  . thiamine injection  100 mg Intravenous Daily   Continuous Infusions: . sodium chloride    . ampicillin-sulbactam (UNASYN) IV 3 g (10/07/18 1040)  .  sodium bicarbonate  infusion 1000 mL 125 mL/hr at 10/07/18 1300   PRN Meds:.Place/Maintain arterial line **AND** sodium chloride, acetaminophen, docusate, midazolam, midazolam, ondansetron (ZOFRAN) IV Medications Prior to Admission:  Prior to Admission medications   Not on File   No Known Allergies Review of Systems unresponsive Physical Exam Young patient On the vent Extremities are cool to touch Patient has been noted to have some decerebrate posturing No blink, no gag has been noted At times, patient furrowing his brow. Low BP on monitor Vent breaths S1 S2  Vital Signs: BP 91/79   Pulse (!) 113   Temp 100.2 F (37.9 C)   Resp (!) 31   Ht  (1.727 m)   Wt 67.9 kg   SpO2 100%   BMI 22.76 kg/m  Pain Scale: 0-10   Pain Score: 0-No pain   SpO2: SpO2: 100 % O2 Device:SpO2: 100 % O2 Flow Rate: .   IO: Intake/output summary:   Intake/Output Summary (Last 24 hours) at 10/07/2018 1549 Last data filed at 10/07/2018 1500 Gross per 24 hour  Intake 2108.61 ml  Output 900 ml  Net 1208.61 ml    LBM:   Baseline Weight: Weight: 68 kg Most recent weight: Weight: 67.9 kg     Palliative Assessment/Data:   Flowsheet Rows     Most Recent Value  Intake Tab  Referral Department  Critical care  Unit at Time of Referral  ICU  Palliative Care Primary Diagnosis  Neurology  Palliative Care Type  New Palliative care  Reason for referral  Clarify Goals of Care  Date first seen by Palliative Care  10/07/18  Clinical Assessment  Palliative Performance Scale Score  10%  Pain Max last 24 hours  3  Pain Min Last 24 hours  2  Dyspnea Max Last 24 Hours  3  Dyspnea Min Last 24 hours  2  Nausea Max Last 24 Hours  0  Nausea Min Last 24 Hours  0  Anxiety Max Last 24 Hours  2  Anxiety Min Last 24 Hours  1   Psychosocial & Spiritual Assessment  Palliative Care Outcomes  Patient/Family meeting held?  No      Time In:  1500 Time Out:  1600 Time Total:  60 Greater than 50%  of this time was spent  counseling and coordinating care related to the above assessment and plan.  Signed by: Rosalin HawkingZeba Jaques Mineer, MD [phone] Please contact Palliative Medicine Team phone at [phone] for questions and concerns.  For individual provider: See Loretha StaplerAmion

## 2018-10-07 NOTE — H&P (Addendum)
..   NAME:  Bobby Weaver, MRN:  [id number], DOB:  08/07/1995, LOS: 0 ADMISSION DATE:  10/17/2018, CONSULTATION DATE:  10/10/2018 REFERRING  MD:  Elliot GurneyZAVIITZ MD, CHIEF COMPLAINT:  Post cardiac arrest   Brief History   23 yr old male w/ no PMHx sig but prev h/o drug use specifically heroin  presents s/p code unknown downtime, intubated by EMS >>ROSC post 2mg  Narcan and 2 Epi. PCCM asked to admit.  History of present illness   ( History obtained from EMR, acct of other providers, and report from family as pt is encephalopathic)  Per Family pt was at home and around 8pm he went to go take a shower. He locked the door to the bathroom. At around 8:40 PM the father reports still hearing the shower but no verbal response from his son. He and the patient's brother break the bathroom door down to find pt on floor laying crossways. There was no evidence of trauma or that he hit his head.   They called 911 and Dad started bystander CPR. EMS took 10-15 mins to arrive per Dad and pt's brother.    Per EMS patient went to go take a shower >>>10 mins later pt found to be down in the bathroom (no h/o hitting head or trauma reported) unclear downtime, EMS arrived Pt was in asystole, EMS gave Narcan 2 mg, Epi x 2 and intubated>>>> [facility]  On my evaluation: Pt is intubated was initially on fentanyl at 50 which I stopped during my assessment.  Kept fentanyl off and even an hr post no withdrawal from pain, no gag pupils blown and fixed.   Past Medical History  .Marland Kitchen. Active Ambulatory Problems    Diagnosis Date Noted  . No Active Ambulatory Problems   Resolved Ambulatory Problems    Diagnosis Date Noted  . No Resolved Ambulatory Problems   No Additional Past Medical History     Significant Hospital Events   S/p cardiac arrest  Consults:  10/07/2018>>> PCCM  Procedures:  Endotracheal intubation with EMS  Significant Diagnostic Tests:  CXR FINDINGS: Endotracheal tube tip is  at the level of the clavicular heads. Lungs are clear. Normal cardiomediastinal contours.   CTH: IMPRESSION: 1. Diffuse brain edema with loss of CSF space and mild effacement of the foramen magnum. Loss of gray-white differentiation in the basal ganglia. Pseudo subarachnoid hemorrhage related to edema. 2. No fracture or subluxation of the cervical spine. 3. Enteric tube coiled in the pharynx, needs repositioning. 4. Incidental note of septal thickening and patchy ground-glass opacities in the lung apices suggesting pulmonary edema.  Micro Data:  10/07/2018    SARSCOV2>>>>>negative 10/07/2018    MRSA PCR>>>>>negative  Antimicrobials:  none   Interim history/subjective:  Pupils blown fixed dilated, no gag, no response to painful stimuli Not requiring sedation  Objective   Blood pressure (!) 150/124, pulse (!) 110, temperature (!) 96.1 F (35.6 C), resp. rate 18, height 5\' 8"  (1.727 m), weight 68 kg, SpO2 (!) 86 %.    Vent Mode: PRVC FiO2 (%):  [30 %-100 %] 30 % Set Rate:  [20 bmp-28 bmp] 28 bmp Vt Set:  [540 mL] 540 mL PEEP:  [5 cmH20] 5 cmH20 Plateau Pressure:  [16 cmH20] 16 cmH20   Intake/Output Summary (Last 24 hours) at 10/07/2018 0102 Last data filed at 10/07/2018 0000 Gross per 24 hour  Intake 1000 ml  Output 700 ml  Net 300 ml   [employer]  10/03/2018 2214  Weight: 68 kg    Examination: General: thin male intubated  HENT: normocephalic atraumatic ETT in oropharynx Lungs: crackles bilaterally Cardiovascular: S1 and S2 no murmur no rub Abdomen: soft scaphoid abdomen + BS Extremities: no edema no atrophy Neuro: pupils + 4 fixed dilated no gag no corneal no withdrawal from painful stimuli Skin: no rashes no breakdown   Assessment & Plan:  1. Cardiorespiratory Arrest- post resuscitation mgmt Overdose>>UDS positive for cocaine and amphetamines Unsure of intent Unknown Downtime most likely >87mins after clarifying story Not a candidate for TTM Plan: Admit  to ICU Normothermia Continue on Cardiac monitoring  MAP goal >24mmHg Not on Vasopressors at time of evaluation  2. Acute Metabolic Encephalopathy Secondary to polysubstance abuse and possible Anoxic injury Poor neurological exam  CTH shows Diffuse brain edema with loss of CSF space and mild effacement of the foramen magnum.  Loss of gray-white differentiation in the basal ganglia.  Pseudo subarachnoid hemorrhage related to edema. Plan: Neurochecks currently not requiring sedation, prn Versed ordered Consult to Neurology given CT Head findings.   3. Acute Kidney Injury: Anion Gap Metabolic Acidosis LA 9 to 4.7 UA: 100 protein ph 6 Glucose >500 no nitrites or leuks UDS: Positive for Amphetamines, Cocaine and THC Abnormal electrolytes: Phos >30 Mg 2.8 Bicarb 20 AG 16 Plan: Check CK>>? rhabdo Repeat Phos to confirm  Placed indwelling foley cath Started on Bicarb gtt and IVF Consult to Nephrology  4. Acute Respiratory Insufficiency hypoxia and Hypercarbia Pulmonary edema on CXR Intubated on PRVC Plan: TV 8cc/kg increased resp rate  FiO2 and PEEP>>>goal O2 sat >95% F/u ABG In setting of AKI will discuss with renal regarding euvolemia pt may have decreased responsiveness to IV diuretics   5. Polysubstance Abuse Family admits patient has tried several substances They found cocaine in his wallet after this event They also state that pt drinks alcohol and smokes Plan: Versed for sedation Avoid beta blockers given cocaine h/o Folic acid and Thiamine w/ CIWA protocol given ETOH use If pt is stable will evaluate for a nicotine patch  Best practice:  Diet: NPO Pain/Anxiety/Delirium protocol (if indicated): versed if needed VAP protocol (if indicated): indicated DVT prophylaxis: Heparin [place] with SCDs GI prophylaxis: pepcid Glucose control: if BG exceeds 180mg /dl will start ISS. No h/o DM Mobility: Bedrest Code Status: Full Family Communication: large family here in  consultation room in ED, Dad mom, grandmothers, girlfriend(who is pregnant), siblings all present for conversation. I was able to answer any questions they had and updated them on pt's clinical status. Disposition: ICU   Labs   CBC: Recent Labs  Lab 10/12/2018 2217 10/19/2018 2241 10/10/2018 2257  WBC 14.5*  --   --   NEUTROABS 5.6  --   --   HGB 13.3 13.3 13.9  HCT 42.8 39.0 41.0  MCV 98.8  --   --   PLT 377  --   --     Basic Metabolic Panel: Recent Labs  Lab 09/23/2018 2217 10/10/2018 2241 10/13/2018 2257  NA 142 138 138  K 5.2* 4.9 6.0*  CL 107  --   --   CO2 16*  --   --   GLUCOSE 347*  --   --   BUN 13  --   --   CREATININE 2.24*  --   --   CALCIUM 8.2*  --   --    GFR: Estimated Creatinine Clearance: 50.2 mL/min (A) (by C-G formula based on SCr of 2.24 mg/dL (  H)). Recent Labs  Lab October 25, 2018 2217  WBC 14.5*  LATICACIDVEN 9.0*    Liver Function Tests: Recent Labs  Lab 2018-10-25 2217  AST 145*  ALT 116*  ALKPHOS 64  BILITOT 0.7  PROT 5.4*  ALBUMIN 3.1*   No results for input(s): LIPASE, AMYLASE in the last 168 hours. No results for input(s): AMMONIA in the last 168 hours.  ABG    Component Value Date/Time   PHART 7.075 (LL) October 25, 2018 2257   PCO2ART 67.1 (HH) 25-Oct-2018 2257   PO2ART 578.0 (H) 2018/10/25 2257   HCO3 19.7 (L) 10/25/18 2257   TCO2 22 10-25-18 2257   ACIDBASEDEF 11.0 (H) 10/25/2018 2257   O2SAT 100.0 Oct 25, 2018 2257     Coagulation Profile: No results for input(s): INR, PROTIME in the last 168 hours.  Cardiac Enzymes: No results for input(s): CKTOTAL, CKMB, CKMBINDEX, TROPONINI in the last 168 hours.  HbA1C: No results found for: HGBA1C  CBG: No results for input(s): GLUCAP in the last 168 hours.  Review of Systems:   Marland KitchenMarland KitchenReview of Systems  Unable to perform ROS: Mental status change    Past Medical History  He,  has no past medical history on file.   Surgical History   History reviewed. No pertinent surgical history.    Social History   reports that he has never smoked. He has never used smokeless tobacco. He reports current drug use. Drug: IV.   Family History   His family history is not on file.   Allergies No Known Allergies   Home Medications  Prior to Admission medications   Not on File    STAFF NOTE  I, Dr Newell Coral have personally reviewed patient's available data, including medical history, events of note, physical examination and test results as part of my evaluation. I have discussed with other care providers such as pharmacist, RN and Elink.  In addition,  I personally evaluated patient  The patient is critically ill with multiple organ systems failure and requires high complexity decision making for assessment and support, frequent evaluation and titration of therapies, application of advanced monitoring technologies and extensive interpretation of multiple databases.   Critical Care Time devoted to patient care services described in this note is  75 Minutes. This time reflects time of care of this signee Dr Newell Coral. This critical care time does not reflect procedure time, or teaching time or supervisory time but could involve care discussion time     Dr. Newell Coral Pulmonary Critical Care Medicine  10/07/2018 6:07 AM   Critical care time: 75 mins

## 2018-10-07 NOTE — Consult Note (Addendum)
NEUROLOGY CONSULT  Reason for Consult: Neurologic opinion of HIE post arrest Referring Physician: Dr Jodi Mourning CC: unable  HPI: Bobby Weaver is an 23 y.o. male with known IVDA with heroin. On 5/16 at ~0800 pt went to take a shower, family noticed that water was still running ~70min later and found him down unresponsive. EMS was called, found to be asystole. CPR, narcan and epi given with ROSC. He has been intubated, fevering and worsening hypoxia overnight. CTH shows already devastating anoxic injury and subsequent SAH.   Past Medical History History reviewed. No pertinent past medical history.  Past Surgical History History reviewed. No pertinent surgical history.  Family History No family history on file.  Social History    reports that he has never smoked. He has never used smokeless tobacco. He reports current drug use. Drug: IV. No history on file for alcohol.  Allergies No Known Allergies  Home Medications No medications prior to admission.    Hospital Medications . Chlorhexidine Gluconate Cloth  6 each Topical Q0600  . famotidine  20 mg Oral BID  . folic acid  1 mg Intravenous Daily  . gabapentin  100 mg Per Tube Q8H  . heparin  5,000 Units Subcutaneous Q8H  . midazolam      . thiamine injection  100 mg Intravenous Daily     ROS: unable   Physical Examination:  Vitals:   10/07/18 1015 10/07/18 1030 10/07/18 1045 10/07/18 1053  BP: (!) 82/70 (!)  Pulse:    (!) 109  Resp: (!) 24 (!) 28 (!) 26 (!) 30  Temp: 99.7 F (37.6 C) 100 F (37.8 C) (!) 100.4 F (38 C)   TempSrc:      SpO2:    100%  Weight:      Height:        General - critically ill Heart - Regular rate and rhythm - no murmer Lungs - Clear to auscultation Abdomen - Soft - non tender Extremities - Distal pulses intact - no edema Skin - Warm and dry  Neurologic Examination:  GCS= 1, 1, ever so slight decerebrate posturing seen in bilat legs:2= 4 Mental StatusCN:  No  spontaneous eye opening. Passive opening shows 1mm non-responsive pupils. No OCR, No blink. No gag/cough to deep suction. At times during exam there was some rhythmic jaw thrusting, this was more notable to noxious stimuli. Motor/Sensory: With central repeated noxious stimuli there was a delayed, ever so slight decerebrate posturing of both legs only.  Plantars: mute Cerebellar: unable Gait: not tested  LABORATORY STUDIES:  Basic Metabolic Panel: Recent Labs  Lab 31-Oct-2018 2217  10/07/18 0123 10/07/18 0126 10/07/18 0244 10/07/18 0600 10/07/18 1059  NA 142   < > 140 141 143 151* 150*  K 5.2*   < > 4.8 5.0 5.1 4.4 3.8  CL 107  --   --   --  107 108 105  CO2 16*  --   --   --  20* 21* 26  GLUCOSE 347*  --   --   --  199* 101* 98  BUN 13  --   --   --  18 20 23*  CREATININE 2.24*  --   --   --  2.92* 2.61* 2.56*  CALCIUM 8.2*  --   --   --  7.7* 7.6* 7.4*  MG  --   --   --   --  2.8* 2.4  --   PHOS  --   --   --   --  >  30.0* 9.0*  --    < > = values in this interval not displayed.    Liver Function Tests: Recent Labs  Lab 10/04/2018 2217 10/07/18 0244  AST 145* 213*  ALT 116* 156*  ALKPHOS 64 119  BILITOT 0.7 0.6  PROT 5.4* 6.1*  ALBUMIN 3.1* 3.3*   No results for input(s): LIPASE, AMYLASE in the last 168 hours. No results for input(s): AMMONIA in the last 168 hours.  CBC: Recent Labs  Lab 10/05/2018 2217  10/04/2018 2257 10/07/18 0103 10/07/18 0123 10/07/18 0126 10/07/18 0600  WBC 14.5*  --   --   --   --   --  6.5  NEUTROABS 5.6  --   --   --   --   --  4.7  HGB 13.3   < > 13.9 20.4* 20.1* 20.4* 19.3*  HCT 42.8   < > 41.0 60.0* 59.0* 60.0* 59.4*  MCV 98.8  --   --   --   --   --  94.6  PLT 377  --   --   --   --   --  273   < > = values in this interval not displayed.    Cardiac Enzymes: Recent Labs  Lab 10/07/18 0600  CKTOTAL 574*  TROPONINI 1.04*    BNP: Invalid input(s): POCBNP  CBG: Recent Labs  Lab 10/07/18 0347 10/07/18 0730  GLUCAP 117*  105*    Microbiology:   Coagulation Studies: No results for input(s): LABPROT, INR in the last 72 hours.  Urinalysis:  Recent Labs  Lab 09/21/2018 2257  COLORURINE YELLOW  LABSPEC 1.013  PHURINE 6.0  GLUCOSEU >=500*  HGBUR SMALL*  BILIRUBINUR NEGATIVE  KETONESUR NEGATIVE  PROTEINUR 100*  NITRITE NEGATIVE  LEUKOCYTESUR NEGATIVE    Lipid Panel:  No results found for: CHOL, TRIG, HDL, CHOLHDL, VLDL, LDLCALC  HgbA1C:  No results found for: HGBA1C  Urine Drug Screen:      Component Value Date/Time   LABOPIA NONE DETECTED 09/28/2018 2257   COCAINSCRNUR POSITIVE (A) 10/21/2018 2257   LABBENZ NONE DETECTED 10/17/2018 2257   AMPHETMU POSITIVE (A) 10/13/2018 2257   THCU POSITIVE (A) 09/21/2018 2257   LABBARB NONE DETECTED 09/26/2018 2257     Alcohol Level:  Recent Labs  Lab 10/05/2018 2217  ETH <10    Miscellaneous labs:  EKG  EKG  IMAGING: Dg Abdomen 1 View  Result Date: 09/25/2018 CLINICAL DATA:  Cardiopulmonary resuscitation after arrest. EXAM: ABDOMEN - 1 VIEW COMPARISON:  None. FINDINGS: Multiple lines and tubes overlie the abdomen. There is tip what may be a weighted enteric tube projecting over the stomach, but this is unclear. The stomach is markedly distended with gas. IMPRESSION: Markedly gas distended stomach without definite visualization of nasogastric tube. It is unclear whether the tubular structure overlying the gastric body is internal or external. Electronically Signed   By: Deatra Robinson M.D.   On: 09/27/2018 22:54   Ct Head Wo Contrast  Result Date: 10/07/2018 CLINICAL DATA:  Head trauma, minor, GCS>=13, high clinical risk, initial exam; C-spine trauma, high clinical risk (NEXUS/CCR). Found unresponsive in the shower, heroin use. EXAM: CT HEAD WITHOUT CONTRAST CT CERVICAL SPINE WITHOUT CONTRAST TECHNIQUE: Multidetector CT imaging of the head and cervical spine was performed following the standard protocol without intravenous contrast. Multiplanar CT  image reconstructions of the cervical spine were also generated. COMPARISON:  None. FINDINGS: CT HEAD FINDINGS Brain: Diffuse sulcal effacement and loss of CSF space. Lateral ventricles are  slightly small. There is mild effacement of foramen magnum. Apparent increased density in subarachnoid spaces likely represents pseudo subarachnoid hemorrhage related to brain edema. Loss of gray-white differentiation in the basal ganglia. Gray-white differentiation of the cerebral hemispheres is preserved. No subdural or extra-axial collection. Vascular: Increased density of the MCAs and ACAs, likely secondary to cerebral edema. Skull: No fracture or focal lesion. Sinuses/Orbits: Mucosal thickening throughout the paranasal sinuses with fluid in the sphenoid sinus and maxillary sinuses, possibly related to intubation. Mastoid air cells are clear. Visualized orbits are unremarkable. Other: None. CT CERVICAL SPINE FINDINGS Alignment: Normal. Skull base and vertebrae: No acute fracture. Vertebral body heights are maintained. The dens and skull base are intact. Soft tissues and spinal canal: Fluid in the pharynx. Enteric tube is looped in the pharynx. No evidence of canal hematoma. Disc levels:  Preserved. Upper chest: Septal thickening and patchy ground-glass opacities suggesting pulmonary edema. Other: None. IMPRESSION: 1. Diffuse brain edema with loss of CSF space and mild effacement of the foramen magnum. Loss of gray-white differentiation in the basal ganglia. Pseudo subarachnoid hemorrhage related to edema. 2. No fracture or subluxation of the cervical spine. 3. Enteric tube coiled in the pharynx, needs repositioning. 4. Incidental note of septal thickening and patchy ground-glass opacities in the lung apices suggesting pulmonary edema. These results were called by telephone at the time of interpretation on 10/07/2018 at 12:02 am to Dr. Blane OharaJOSHUA ZAVITZ , who verbally acknowledged these results. Electronically Signed   By: Narda RutherfordMelanie   Sanford M.D.   On: 10/07/2018 00:02   Ct Cervical Spine Wo Contrast  Result Date: 10/07/2018 CLINICAL DATA:  Head trauma, minor, GCS>=13, high clinical risk, initial exam; C-spine trauma, high clinical risk (NEXUS/CCR). Found unresponsive in the shower, heroin use. EXAM: CT HEAD WITHOUT CONTRAST CT CERVICAL SPINE WITHOUT CONTRAST TECHNIQUE: Multidetector CT imaging of the head and cervical spine was performed following the standard protocol without intravenous contrast. Multiplanar CT image reconstructions of the cervical spine were also generated. COMPARISON:  None. FINDINGS: CT HEAD FINDINGS Brain: Diffuse sulcal effacement and loss of CSF space. Lateral ventricles are slightly small. There is mild effacement of foramen magnum. Apparent increased density in subarachnoid spaces likely represents pseudo subarachnoid hemorrhage related to brain edema. Loss of gray-white differentiation in the basal ganglia. Gray-white differentiation of the cerebral hemispheres is preserved. No subdural or extra-axial collection. Vascular: Increased density of the MCAs and ACAs, likely secondary to cerebral edema. Skull: No fracture or focal lesion. Sinuses/Orbits: Mucosal thickening throughout the paranasal sinuses with fluid in the sphenoid sinus and maxillary sinuses, possibly related to intubation. Mastoid air cells are clear. Visualized orbits are unremarkable. Other: None. CT CERVICAL SPINE FINDINGS Alignment: Normal. Skull base and vertebrae: No acute fracture. Vertebral body heights are maintained. The dens and skull base are intact. Soft tissues and spinal canal: Fluid in the pharynx. Enteric tube is looped in the pharynx. No evidence of canal hematoma. Disc levels:  Preserved. Upper chest: Septal thickening and patchy ground-glass opacities suggesting pulmonary edema. Other: None. IMPRESSION: 1. Diffuse brain edema with loss of CSF space and mild effacement of the foramen magnum. Loss of gray-white differentiation in  the basal ganglia. Pseudo subarachnoid hemorrhage related to edema. 2. No fracture or subluxation of the cervical spine. 3. Enteric tube coiled in the pharynx, needs repositioning. 4. Incidental note of septal thickening and patchy ground-glass opacities in the lung apices suggesting pulmonary edema. These results were called by telephone at the time of interpretation on 10/07/2018 at 12:02  am to Dr. Blane Ohara , who verbally acknowledged these results. Electronically Signed   By: Narda Rutherford M.D.   On: 10/07/2018 00:02   Dg Chest Portable 1 View  Result Date: 10/03/2018 CLINICAL DATA:  Intubation EXAM: PORTABLE CHEST 1 VIEW COMPARISON:  None. FINDINGS: Endotracheal tube tip is at the level of the clavicular heads. Lungs are clear. Normal cardiomediastinal contours. IMPRESSION: ET tube tip at the level of the clavicular heads. Electronically Signed   By: Deatra Robinson M.D.   On: 10/05/2018 22:52     Assessment/Plan: 23yr old s/p cardia arrest due to IV Heroin use. Extended down time ~57min. CTH shows early changes with diffuse edema and subsequent SAH. This is c/w severe HIE.  # IVDA- heroid OD # Asystole Cardiac Arrest and Hypoxic Respiratory Failure- d/t above # GCS 3-4 upon arrival- d/t above. myoclonus noted and mild posturing only. 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 This is c/w exam findings  PLAN: CTH in am EEG Supportive care till GOC can be discussed with family Recommend Palliative Care consult    Desiree Metzger-Cihelka, ARNP-C, ANVP-BC Attending neurologist's note to follow  I have seen the patient and reviewed the above note.  Based on his initial CT and his current poor exam, I strongly suspect that he is going to have poor prognosis.  Given his young age, I would favor giving him until 72 hours, but if he continues to have absent either pupils or corneals at that time then I think we can say that  this represents poor prognosis.  He may progress to brain death.  If myoclonus becomes a progressive concern, propofol can be used to help suppress the movements though it does not improve outcome.  EEG Repeat CT in the morning Keppra for myoclonus  This patient is critically ill and at significant risk of neurological worsening, death and care requires constant monitoring of vital signs, hemodynamics,respiratory and cardiac monitoring, neurological assessment, discussion with family, other specialists and medical decision making of high complexity. I spent 35 minutes of neurocritical care time  in the care of  this patient. This was time spent independent of any time provided by nurse practitioner or PA.  Ritta Slot, MD [facility] [phone]  If 7pm- 7am, please page neurology on call as listed in AMION. 10/07/2018  5:54 PM

## 2018-10-07 NOTE — ED Notes (Signed)
Critical care at bedside wants fentanyl drip stopped  done

## 2018-10-07 NOTE — ED Notes (Signed)
Mother at the bedside  Pt has intermittent forward  Movement of his head pupils remain fixed and dilated

## 2018-10-07 NOTE — Progress Notes (Signed)
EEG complete - results pending 

## 2018-10-07 NOTE — ED Notes (Signed)
pts bp lower  Critical care notified  Order for 1 liter of lactated ringers bolused  Temp going up more tachycardic  02 sat 93 %

## 2018-10-07 NOTE — Consult Note (Signed)
[facility]  INPATIENT CONSULTATION  Reason for Consultation: AKI Requesting Provider: Dr. Oval Linsey  HPI: Bobby Weaver is an 23 y.o. male with PMH + only for h/o heroin abuse who presented after being found down, code in field.  Nephrology is consulted for AKI.   History obtained from chart as patient currently intubated and sedated.  At home, went to shower 8pm, 8:40pm still shower running and he didn't respond so dad broke door down and found him lying on floor.  No e/o trauma.  Called 911, dad did CPR 10-61min. EMS found asystole gave narcan, epi x 2, intubated. On PCCM assessment pt on fentanyl gtt which was held. At that time pupils dilated and fixed.  U tox + THC, amphetamines, cocaine.  EtOH and salicylates undetectable.  Head CT showed diffuse edema and loss of gray white differentiation.   He has remained off sedation and per RN pupils appear a bit smaller but not reactive: non responsive to stimulation.    I/Os 1/0.7.  Foley draining clear yellow urine.   PMH: History reviewed. No pertinent past medical history. PSH: History reviewed. No pertinent surgical history.  Medications:  I have reviewed the patient's current medications.  No medications prior to admission.    ALLERGIES:  No Known Allergies  FAM HX: No family history on file.  Social History:   reports that he has never smoked. He has never used smokeless tobacco. He reports current drug use. Drug: IV. No history on file for alcohol.  ROS: unable to obtain from intubated and unresponsive pt  Blood pressure (!) 107/58, pulse (!) 111, temperature 98.1 F (36.7 C), resp. rate (!) 30, height  (1.727 m), weight 67.9 kg, SpO2 100 %. PHYSICAL EXAM: Gen: lying unresponsive on vent  Eyes: pupils per RN 3+, non reactive ENT: ETT in place Neck: supple CV: tachycardic, no rub Abd: mod distended, no apparent TTP GU: foley with clear yellow urine Extr: no edema Neuro: does not respond to  stimulation Skin: cool and dry   Results for orders placed or performed during the hospital encounter of 10-19-2018 (from the past 48 hour(s))  CBC with Differential     Status: Abnormal   Collection Time: 2018/10/19 10:17 PM  Result Value Ref Range   WBC 14.5 (H) 4.0 - 10.5 K/uL   RBC 4.33 4.22 - 5.81 MIL/uL   Hemoglobin 13.3 13.0 - 17.0 g/dL   HCT 16.1 09.6 - 04.5 %   MCV 98.8 80.0 - 100.0 fL   MCH 30.7 26.0 - 34.0 pg   MCHC 31.1 30.0 - 36.0 g/dL   RDW 40.9 81.1 - 91.4 %   Platelets 377 150 - 400 K/uL   nRBC 0.0 0.0 - 0.2 %   Neutrophils Relative % 38 %   Neutro Abs 5.6 1.7 - 7.7 K/uL   Lymphocytes Relative 49 %   Lymphs Abs 7.2 (H) 0.7 - 4.0 K/uL   Monocytes Relative 5 %   Monocytes Absolute 0.7 0.1 - 1.0 K/uL   Eosinophils Relative 2 %   Eosinophils Absolute 0.2 0.0 - 0.5 K/uL   Basophils Relative 1 %   Basophils Absolute 0.1 0.0 - 0.1 K/uL   Immature Granulocytes 5 %   Abs Immature Granulocytes 0.72 (H) 0.00 - 0.07 K/uL    Comment: Performed at [facility], [address], [place], [place] [address]  Comprehensive metabolic panel     Status: Abnormal   Collection Time: 10-19-2018 10:17 PM  Result Value  Ref Range   Sodium 142 135 - 145 mmol/L   Potassium 5.2 (H) 3.5 - 5.1 mmol/L   Chloride 107 98 - 111 mmol/L   CO2 16 (L) 22 - 32 mmol/L   Glucose, Bld 347 (H) 70 - 99 mg/dL   BUN 13 6 - 20 mg/dL   Creatinine, Ser 1.61 (H) 0.61 - 1.24 mg/dL   Calcium 8.2 (L) 8.9 - 10.3 mg/dL   Total Protein 5.4 (L) 6.5 - 8.1 g/dL   Albumin 3.1 (L) 3.5 - 5.0 g/dL   AST 096 (H) 15 - 41 U/L   ALT 116 (H) 0 - 44 U/L    Comment: RESULTS CONFIRMED BY MANUAL DILUTION   Alkaline Phosphatase 64 38 - 126 U/L   Total Bilirubin 0.7 0.3 - 1.2 mg/dL   GFR calc non Af Amer 40 (L) >60 mL/min   GFR calc Af Amer 47 (L) >60 mL/min   Anion gap 19 (H) 5 - 15    Comment: Performed at [facility], [address] [place], [place] [address]  Lactic acid, plasma     Status: Abnormal   Collection  Time: 10/07/2018 10:17 PM  Result Value Ref Range   Lactic Acid, Venous 9.0 (HH) 0.5 - 1.9 mmol/L    Comment: CRITICAL RESULT CALLED TO, READ BACK BY AND VERIFIED WITH: CHRISCOE,C RN 2018-10-07 2338 JORDANS Performed at [facility], [address] [place], [place] [address]   Ethanol     Status: None   Collection Time: 10-07-18 10:17 PM  Result Value Ref Range   Alcohol, Ethyl (B) <10 <10 mg/dL    Comment: (NOTE) Lowest detectable limit for serum alcohol is 10 mg/dL. For medical purposes only. Performed at [facility], [address] [place], [place] [address]   Acetaminophen level     Status: Abnormal   Collection Time: 10/07/18 10:17 PM  Result Value Ref Range   Acetaminophen (Tylenol), Serum <10 (L) 10 - 30 ug/mL    Comment: Performed at [facility], [address], [place], [place] [address]  Salicylate level     Status: None   Collection Time: 2018/10/07 10:17 PM  Result Value Ref Range   Salicylate Lvl <7.0 2.8 - 30.0 mg/dL    Comment: Performed at [facility], [address] [place], [place] [address]  SARS Coronavirus 2 (CEPHEID - Performed in [facility] hospital lab), Hosp Order     Status: None   Collection Time: 10-07-18 10:40 PM  Result Value Ref Range   SARS Coronavirus 2 NEGATIVE NEGATIVE    Comment: (NOTE) If result is NEGATIVE SARS-CoV-2 target nucleic acids are NOT DETECTED. The SARS-CoV-2 RNA is generally detectable in upper and lower  respiratory specimens during the acute phase of infection. The lowest  concentration of SARS-CoV-2 viral copies this assay can detect is 250  copies / mL. A negative result does not preclude SARS-CoV-2 infection  and should not be used as the sole basis for treatment or other  patient management decisions.  A negative result may occur with  improper specimen collection / handling, submission of specimen other  than nasopharyngeal swab, presence of viral mutation(s) within the  areas targeted by  this assay, and inadequate number of viral copies  (<250 copies / mL). A negative result must be combined with clinical  observations, patient history, and epidemiological information. If result is POSITIVE SARS-CoV-2 target nucleic acids are DETECTED. The SARS-CoV-2 RNA is generally  detectable in upper and lower  respiratory specimens dur ing the acute phase of infection.  Positive  results are indicative of active infection with SARS-CoV-2.  Clinical  correlation with patient history and other diagnostic information is  necessary to determine patient infection status.  Positive results do  not rule out bacterial infection or co-infection with other viruses. If result is PRESUMPTIVE POSTIVE SARS-CoV-2 nucleic acids MAY BE PRESENT.   A presumptive positive result was obtained on the submitted specimen  and confirmed on repeat testing.  While 2019 novel coronavirus  (SARS-CoV-2) nucleic acids may be present in the submitted sample  additional confirmatory testing may be necessary for epidemiological  and / or clinical management purposes  to differentiate between  SARS-CoV-2 and other Sarbecovirus currently known to infect humans.  If clinically indicated additional testing with an alternate test  methodology [id number]) is advised. The SARS-CoV-2 RNA is generally  detectable in upper and lower respiratory sp ecimens during the acute  phase of infection. The expected result is Negative. Fact Sheet for Patients:  BoilerBrush.com.cy Fact Sheet for Healthcare Providers: https://pope.com/ This test is not yet approved or cleared by the [place] FDA and has been authorized for detection and/or diagnosis of SARS-CoV-2 by FDA under an Emergency Use Authorization (EUA).  This EUA will remain in effect (meaning this test can be used) for the duration of the COVID-19 declaration under Section 564(b)(1) of the Act, 21 U.S.C. section  360bbb-3(b)(1), unless the authorization is terminated or revoked sooner. Performed at [facility], [address] [place], [place] [address]   POCT I-Stat EG7     Status: Abnormal   Collection Time: 10/01/2018 10:41 PM  Result Value Ref Range   pH, Ven 6.951 (LL) 7.250 - 7.430   pCO2, Ven 69.6 (H) 44.0 - 60.0 mmHg   pO2, Ven 170.0 (H) 32.0 - 45.0 mmHg   Bicarbonate 15.3 (L) 20.0 - 28.0 mmol/L   TCO2 17 (L) 22 - 32 mmol/L   O2 Saturation 98.0 %   Acid-base deficit 17.0 (H) 0.0 - 2.0 mmol/L   Sodium 138 135 - 145 mmol/L   Potassium 4.9 3.5 - 5.1 mmol/L   Calcium, Ion 1.02 (L) 1.15 - 1.40 mmol/L   HCT 39.0 39.0 - 52.0 %   Hemoglobin 13.3 13.0 - 17.0 g/dL   Patient temperature HIDE    Sample type VENOUS    Comment NOTIFIED PHYSICIAN   Urinalysis, Routine w reflex microscopic     Status: Abnormal   Collection Time: 10/17/2018 10:57 PM  Result Value Ref Range   Color, Urine YELLOW YELLOW   APPearance HAZY (A) CLEAR   Specific Gravity, Urine 1.013 1.005 - 1.030   pH 6.0 5.0 - 8.0   Glucose, UA >=500 (A) NEGATIVE mg/dL   Hgb urine dipstick SMALL (A) NEGATIVE   Bilirubin Urine NEGATIVE NEGATIVE   Ketones, ur NEGATIVE NEGATIVE mg/dL   Protein, ur 811 (A) NEGATIVE mg/dL   Nitrite NEGATIVE NEGATIVE   Leukocytes,Ua NEGATIVE NEGATIVE   RBC / HPF 11-20 0 - 5 RBC/hpf   WBC, UA 11-20 0 - 5 WBC/hpf   Bacteria, UA RARE (A) NONE SEEN   Squamous Epithelial / LPF 0-5 0 - 5   Mucus PRESENT    Sperm, UA PRESENT     Comment: Performed at [facility], [address] [place], [place] [address]  Rapid urine drug screen (hospital performed)     Status: Abnormal   Collection Time: 09/27/2018 10:57  PM  Result Value Ref Range   Opiates NONE DETECTED NONE DETECTED   Cocaine POSITIVE (A) NONE DETECTED   Benzodiazepines NONE DETECTED NONE DETECTED   Amphetamines POSITIVE (A) NONE DETECTED   Tetrahydrocannabinol POSITIVE (A) NONE DETECTED   Barbiturates NONE DETECTED NONE DETECTED     Comment: (NOTE) DRUG SCREEN FOR MEDICAL PURPOSES ONLY.  IF CONFIRMATION IS NEEDED FOR ANY PURPOSE, NOTIFY LAB WITHIN 5 DAYS. LOWEST DETECTABLE LIMITS FOR URINE DRUG SCREEN Drug Class                     Cutoff (ng/mL) Amphetamine and metabolites    1000 Barbiturate and metabolites    200 Benzodiazepine                 200 Tricyclics and metabolites     300 Opiates and metabolites        300 Cocaine and metabolites        300 THC                            50 Performed at [facility], [address], [place], [place] [address]   I-STAT 7, (LYTES, BLD GAS, ICA, H+H)     Status: Abnormal   Collection Time: 10/12/2018 10:57 PM  Result Value Ref Range   pH, Arterial 7.075 (LL) 7.350 - 7.450   pCO2 arterial 67.1 (HH) 32.0 - 48.0 mmHg   pO2, Arterial 578.0 (H) 83.0 - 108.0 mmHg   Bicarbonate 19.7 (L) 20.0 - 28.0 mmol/L   TCO2 22 22 - 32 mmol/L   O2 Saturation 100.0 %   Acid-base deficit 11.0 (H) 0.0 - 2.0 mmol/L   Sodium 138 135 - 145 mmol/L   Potassium 6.0 (H) 3.5 - 5.1 mmol/L   Calcium, Ion 1.08 (L) 1.15 - 1.40 mmol/L   HCT 41.0 39.0 - 52.0 %   Hemoglobin 13.9 13.0 - 17.0 g/dL   Patient temperature HIDE    Sample type ARTERIAL    Comment NOTIFIED PHYSICIAN   I-STAT 7, (LYTES, BLD GAS, ICA, H+H)     Status: Abnormal   Collection Time: 10/07/18  1:03 AM  Result Value Ref Range   pH, Arterial 6.992 (LL) 7.350 - 7.450   pCO2 arterial 72.6 (HH) 32.0 - 48.0 mmHg   pO2, Arterial 59.0 (L) 83.0 - 108.0 mmHg   Bicarbonate 17.8 (L) 20.0 - 28.0 mmol/L   TCO2 20 (L) 22 - 32 mmol/L   O2 Saturation 76.0 %   Acid-base deficit 16.0 (H) 0.0 - 2.0 mmol/L   Sodium 142 135 - 145 mmol/L   Potassium 4.7 3.5 - 5.1 mmol/L   Calcium, Ion 1.02 (L) 1.15 - 1.40 mmol/L   HCT 60.0 (H) 39.0 - 52.0 %   Hemoglobin 20.4 (H) 13.0 - 17.0 g/dL   Patient temperature 60.4 F    Collection site RADIAL, ALLEN'S TEST ACCEPTABLE    Drawn by RT    Sample type ARTERIAL    Comment NOTIFIED PHYSICIAN   I-STAT  7, (LYTES, BLD GAS, ICA, H+H)     Status: Abnormal   Collection Time: 10/07/18  1:23 AM  Result Value Ref Range   pH, Arterial 6.982 (LL) 7.350 - 7.450   pCO2 arterial 76.5 (HH) 32.0 - 48.0 mmHg   pO2, Arterial 61.0 (L) 83.0 - 108.0 mmHg   Bicarbonate 18.4 (L) 20.0 - 28.0 mmol/L   TCO2 21 (L) 22 - 32  mmol/L   O2 Saturation 77.0 %   Acid-base deficit 16.0 (H) 0.0 - 2.0 mmol/L   Sodium 140 135 - 145 mmol/L   Potassium 4.8 3.5 - 5.1 mmol/L   Calcium, Ion 1.07 (L) 1.15 - 1.40 mmol/L   HCT 59.0 (H) 39.0 - 52.0 %   Hemoglobin 20.1 (H) 13.0 - 17.0 g/dL   Patient temperature 16.1 F    Collection site RADIAL, ALLEN'S TEST ACCEPTABLE    Drawn by RT    Sample type ARTERIAL    Comment NOTIFIED PHYSICIAN   I-STAT 7, (LYTES, BLD GAS, ICA, H+H)     Status: Abnormal   Collection Time: 10/07/18  1:26 AM  Result Value Ref Range   pH, Arterial 7.088 (LL) 7.350 - 7.450   pCO2 arterial 46.7 32.0 - 48.0 mmHg   pO2, Arterial 67.0 (L) 83.0 - 108.0 mmHg   Bicarbonate 14.3 (L) 20.0 - 28.0 mmol/L   TCO2 16 (L) 22 - 32 mmol/L   O2 Saturation 86.0 %   Acid-base deficit 16.0 (H) 0.0 - 2.0 mmol/L   Sodium 141 135 - 145 mmol/L   Potassium 5.0 3.5 - 5.1 mmol/L   Calcium, Ion 1.04 (L) 1.15 - 1.40 mmol/L   HCT 60.0 (H) 39.0 - 52.0 %   Hemoglobin 20.4 (H) 13.0 - 17.0 g/dL   Patient temperature 09.6 F    Collection site RADIAL, ALLEN'S TEST ACCEPTABLE    Drawn by RT    Sample type ARTERIAL    Comment NOTIFIED PHYSICIAN   Comprehensive metabolic panel     Status: Abnormal   Collection Time: 10/07/18  2:44 AM  Result Value Ref Range   Sodium 143 135 - 145 mmol/L   Potassium 5.1 3.5 - 5.1 mmol/L   Chloride 107 98 - 111 mmol/L   CO2 20 (L) 22 - 32 mmol/L   Glucose, Bld 199 (H) 70 - 99 mg/dL   BUN 18 6 - 20 mg/dL   Creatinine, Ser 0.45 (H) 0.61 - 1.24 mg/dL   Calcium 7.7 (L) 8.9 - 10.3 mg/dL   Total Protein 6.1 (L) 6.5 - 8.1 g/dL   Albumin 3.3 (L) 3.5 - 5.0 g/dL   AST 409 (H) 15 - 41 U/L   ALT 156 (H) 0  - 44 U/L   Alkaline Phosphatase 119 38 - 126 U/L   Total Bilirubin 0.6 0.3 - 1.2 mg/dL   GFR calc non Af Amer 29 (L) >60 mL/min   GFR calc Af Amer 34 (L) >60 mL/min   Anion gap 16 (H) 5 - 15    Comment: Performed at [facility], [address], [place], [place] [address]  Magnesium     Status: Abnormal   Collection Time: 10/07/18  2:44 AM  Result Value Ref Range   Magnesium 2.8 (H) 1.7 - 2.4 mg/dL    Comment: Performed at [facility], [address], [place], [place] [address]  Phosphorus     Status: Abnormal   Collection Time: 10/07/18  2:44 AM  Result Value Ref Range   Phosphorus >30.0 (H) 2.5 - 4.6 mg/dL    Comment: RESULTS CONFIRMED BY MANUAL DILUTION Performed at [facility], [address], [place], [place] [address]   Cortisol     Status: None   Collection Time: 10/07/18  2:44 AM  Result Value Ref Range   Cortisol, Plasma 45.3 ug/dL    Comment: (NOTE) AM    6.7 - 22.6 ug/dL PM   <  10.0       ug/dL Performed at [facility], [address] [place], [place] [address]   Lactic acid, plasma     Status: Abnormal   Collection Time: 10/07/18  2:44 AM  Result Value Ref Range   Lactic Acid, Venous 4.7 (HH) 0.5 - 1.9 mmol/L    Comment: CRITICAL RESULT CALLED TO, READ BACK BY AND VERIFIED WITH: CHRISCOE,C RN 10/07/2018 0324 JORDANS Performed at [facility], [address] [place], [place] [address]   Glucose, capillary     Status: Abnormal   Collection Time: 10/07/18  3:47 AM  Result Value Ref Range   Glucose-Capillary 117 (H) 70 - 99 mg/dL  Blood gas, arterial     Status: Abnormal   Collection Time: 10/07/18  3:56 AM  Result Value Ref Range   FIO2 100.00    Delivery systems VENTILATOR    Mode PRESSURE REGULATED VOLUME CONTROL    VT 540 mL   LHR 28.0 resp/min   Peep/cpap 15.0 cm H20   pH, Arterial 7.015 (LL) 7.350 - 7.450    Comment: CRITICAL RESULT CALLED TO, READ BACK BY AND VERIFIED WITH: TERRI COCKMAN, RRT,RCP AT 0404 BY FRANK  MIKE JR RRT,RCP ON 10/07/2018    pCO2 arterial 78.9 (HH) 32.0 - 48.0 mmHg    Comment: CRITICAL RESULT CALLED TO, READ BACK BY AND VERIFIED WITH: TERRI COCKMAN,RRT,RCP AT 0404 BY FRANK MIKE JR RRT,RCP ON 10/07/2018    pO2, Arterial 155 (H) 83.0 - 108.0 mmHg   Bicarbonate 18.4 (L) 20.0 - 28.0 mmol/L   Acid-base deficit 10.7 (H) 0.0 - 2.0 mmol/L   O2 Saturation 97.3 %   Patient temperature 102.6    Collection site RIGHT RADIAL    Drawn by [phone]    Sample type ARTERIAL    Allens test (pass/fail) PASS PASS  MRSA PCR Screening     Status: None   Collection Time: 10/07/18  4:01 AM  Result Value Ref Range   MRSA by PCR NEGATIVE NEGATIVE    Comment:        The GeneXpert MRSA Assay (FDA approved for NASAL specimens only), is one component of a comprehensive MRSA colonization surveillance program. It is not intended to diagnose MRSA infection nor to guide or monitor treatment for MRSA infections. Performed at [facility], [address], [place], [place] [address]   Blood gas, arterial     Status: Abnormal   Collection Time: 10/07/18  5:35 AM  Result Value Ref Range   FIO2 100.00    Delivery systems VENTILATOR    Mode PRESSURE REGULATED VOLUME CONTROL    VT 600 mL   LHR 30 resp/min   Peep/cpap 15.0 cm H20   pH, Arterial 7.209 (L) 7.350 - 7.450    Comment: CRITICAL RESULT CALLED TO, READ BACK BY AND VERIFIED WITH: TERRI COCKMAN, RRT,RCP AT 0540 BY FRANK MIKE JR RRT,RCP ON 10/07/2018    pCO2 arterial 71.3 (HH) 32.0 - 48.0 mmHg    Comment: CRITICAL RESULT CALLED TO, READ BACK BY AND VERIFIED WITH: TERRI COCKMAN, RRT,RCP AT 0540 BY FRANK MIKE JR RRT,RCP ON 10/07/2018    pO2, Arterial 98.9 83.0 - 108.0 mmHg   Bicarbonate 27.4 20.0 - 28.0 mmol/L   Acid-Base Excess 0.3 0.0 - 2.0 mmol/L   O2 Saturation 96.8 %   Patient temperature 98.6    Collection site RIGHT RADIAL    Drawn by [phone]    Sample type ARTERIAL  Allens test (pass/fail) PASS PASS  Phosphorus     Status:  Abnormal   Collection Time: 10/07/18  6:00 AM  Result Value Ref Range   Phosphorus 9.0 (H) 2.5 - 4.6 mg/dL    Comment: Performed at [facility], [address] [place], [place] [address]  CK     Status: Abnormal   Collection Time: 10/07/18  6:00 AM  Result Value Ref Range   Total CK 574 (H) 49 - 397 U/L    Comment: Performed at [facility], [address] [place], [place] [address]  Glucose, capillary     Status: Abnormal   Collection Time: 10/07/18  7:30 AM  Result Value Ref Range   Glucose-Capillary 105 (H) 70 - 99 mg/dL    Dg Abdomen 1 View  Result Date: 10/07/2018 CLINICAL DATA:  Cardiopulmonary resuscitation after arrest. EXAM: ABDOMEN - 1 VIEW COMPARISON:  None. FINDINGS: Multiple lines and tubes overlie the abdomen. There is tip what may be a weighted enteric tube projecting over the stomach, but this is unclear. The stomach is markedly distended with gas. IMPRESSION: Markedly gas distended stomach without definite visualization of nasogastric tube. It is unclear whether the tubular structure overlying the gastric body is internal or external. Electronically Signed   By: Deatra Robinson M.D.   On: 10/05/2018 22:54   Ct Head Wo Contrast  Result Date: 10/07/2018 CLINICAL DATA:  Head trauma, minor, GCS>=13, high clinical risk, initial exam; C-spine trauma, high clinical risk (NEXUS/CCR). Found unresponsive in the shower, heroin use. EXAM: CT HEAD WITHOUT CONTRAST CT CERVICAL SPINE WITHOUT CONTRAST TECHNIQUE: Multidetector CT imaging of the head and cervical spine was performed following the standard protocol without intravenous contrast. Multiplanar CT image reconstructions of the cervical spine were also generated. COMPARISON:  None. FINDINGS: CT HEAD FINDINGS Brain: Diffuse sulcal effacement and loss of CSF space. Lateral ventricles are slightly small. There is mild effacement of foramen magnum. Apparent increased density in subarachnoid spaces likely represents pseudo  subarachnoid hemorrhage related to brain edema. Loss of gray-white differentiation in the basal ganglia. Gray-white differentiation of the cerebral hemispheres is preserved. No subdural or extra-axial collection. Vascular: Increased density of the MCAs and ACAs, likely secondary to cerebral edema. Skull: No fracture or focal lesion. Sinuses/Orbits: Mucosal thickening throughout the paranasal sinuses with fluid in the sphenoid sinus and maxillary sinuses, possibly related to intubation. Mastoid air cells are clear. Visualized orbits are unremarkable. Other: None. CT CERVICAL SPINE FINDINGS Alignment: Normal. Skull base and vertebrae: No acute fracture. Vertebral body heights are maintained. The dens and skull base are intact. Soft tissues and spinal canal: Fluid in the pharynx. Enteric tube is looped in the pharynx. No evidence of canal hematoma. Disc levels:  Preserved. Upper chest: Septal thickening and patchy ground-glass opacities suggesting pulmonary edema. Other: None. IMPRESSION: 1. Diffuse brain edema with loss of CSF space and mild effacement of the foramen magnum. Loss of gray-white differentiation in the basal ganglia. Pseudo subarachnoid hemorrhage related to edema. 2. No fracture or subluxation of the cervical spine. 3. Enteric tube coiled in the pharynx, needs repositioning. 4. Incidental note of septal thickening and patchy ground-glass opacities in the lung apices suggesting pulmonary edema. These results were called by telephone at the time of interpretation on 10/07/2018 at 12:02 am to Dr. Blane Ohara , who verbally acknowledged these results. Electronically Signed   By: Narda Rutherford M.D.   On: 10/07/2018 00:02   Ct Cervical Spine Wo Contrast  Result Date:  10/07/2018 CLINICAL DATA:  Head trauma, minor, GCS>=13, high clinical risk, initial exam; C-spine trauma, high clinical risk (NEXUS/CCR). Found unresponsive in the shower, heroin use. EXAM: CT HEAD WITHOUT CONTRAST CT CERVICAL SPINE  WITHOUT CONTRAST TECHNIQUE: Multidetector CT imaging of the head and cervical spine was performed following the standard protocol without intravenous contrast. Multiplanar CT image reconstructions of the cervical spine were also generated. COMPARISON:  None. FINDINGS: CT HEAD FINDINGS Brain: Diffuse sulcal effacement and loss of CSF space. Lateral ventricles are slightly small. There is mild effacement of foramen magnum. Apparent increased density in subarachnoid spaces likely represents pseudo subarachnoid hemorrhage related to brain edema. Loss of gray-white differentiation in the basal ganglia. Gray-white differentiation of the cerebral hemispheres is preserved. No subdural or extra-axial collection. Vascular: Increased density of the MCAs and ACAs, likely secondary to cerebral edema. Skull: No fracture or focal lesion. Sinuses/Orbits: Mucosal thickening throughout the paranasal sinuses with fluid in the sphenoid sinus and maxillary sinuses, possibly related to intubation. Mastoid air cells are clear. Visualized orbits are unremarkable. Other: None. CT CERVICAL SPINE FINDINGS Alignment: Normal. Skull base and vertebrae: No acute fracture. Vertebral body heights are maintained. The dens and skull base are intact. Soft tissues and spinal canal: Fluid in the pharynx. Enteric tube is looped in the pharynx. No evidence of canal hematoma. Disc levels:  Preserved. Upper chest: Septal thickening and patchy ground-glass opacities suggesting pulmonary edema. Other: None. IMPRESSION: 1. Diffuse brain edema with loss of CSF space and mild effacement of the foramen magnum. Loss of gray-white differentiation in the basal ganglia. Pseudo subarachnoid hemorrhage related to edema. 2. No fracture or subluxation of the cervical spine. 3. Enteric tube coiled in the pharynx, needs repositioning. 4. Incidental note of septal thickening and patchy ground-glass opacities in the lung apices suggesting pulmonary edema. These results were  called by telephone at the time of interpretation on 10/07/2018 at 12:02 am to Dr. Blane Ohara , who verbally acknowledged these results. Electronically Signed   By: Narda Rutherford M.D.   On: 10/07/2018 00:02   Dg Chest Portable 1 View  Result Date: 11/02/2018 CLINICAL DATA:  Intubation EXAM: PORTABLE CHEST 1 VIEW COMPARISON:  None. FINDINGS: Endotracheal tube tip is at the level of the clavicular heads. Lungs are clear. Normal cardiomediastinal contours. IMPRESSION: ET tube tip at the level of the clavicular heads. Electronically Signed   By: Deatra Robinson M.D.   On: 11/02/18 22:52    Assessment/Plan 1.  Overdose leading to cardiac arrest with unknown downtime:  Likely > down time.  Hemodynamically stable currently but neurologic status is in question.  Off sedation with no interactions or response to date.  CT with diffuse edema.  Suspect anoxic brain injury.   2.  Hypercarbic and hypoxic respiratory failure secondary to #1:  Requiring FiO2 100%, PEEP 12 currently.  CXR was clear on admission though head and neck CT cuts though lungs may have some pulmonary edema.  High dose diuretics if it's thought that he would benefit from diuresis to improve pulmonary status.   3. AKI:  Likely secondary to ATN from cardiac arrest.  CK 574.   Nonoliguric.  No indications for RRT currently and if neurologically devastated would not be a candidate.  Use high dose diuretics at this time to maintain euvolemia or achieve diuresis per above.   4.  Mixed AGMA and resp acidosis:  AKI + lactate ( > 4.7. On bicarb gtt and MV with RR 30.  Last ABG with 7.20,  acceptable.   5.  Hyperphospatemia: initial phos > 30 but repeat 9.  >30 spurious.  Phos 9 likely due to AKI.  No indication for specific therapy at this time, no high enough for acute phosphate nephropathy.   6.  Polysubstance abuse: THC, cocaine, amphetamines in U tox.  Vitamins being repleted by primary.   Tyler PitaLindsay A Jamieka Royle 10/07/2018, 8:37  AM

## 2018-10-07 NOTE — Progress Notes (Addendum)
Received pt from ED, GCS 3, rapid quivering noted at left cheek/jaw, right pupil size 5 and nonreactive left pupil size 4 and nonreactive, ett foley and ogt and peripheral ivs in place. core temp 103.0, pt placed on cooling blanket

## 2018-10-07 NOTE — ED Notes (Signed)
Warming blanket removed from the patient  Opens both his eyes intermittently  As he breathes

## 2018-10-07 NOTE — ED Notes (Signed)
Pulse ox not showing up on the monitor screen  Po  92%

## 2018-10-07 NOTE — Progress Notes (Signed)
   10/07/18 0214  Clinical Encounter Type  Visited With Patient and family together;Health care provider  Visit Type Initial;Critical Care;ED  Referral From Nurse  Spiritual Encounters  Spiritual Needs Prayer;Emotional  Stress Factors  Family Stress Factors Loss;Loss of control;Major life changes;Health changes   Chaplain responded to a post-CPR in the ED. Chaplain assisted in facilitating medical consultation with the family. Chaplain offered prayer and support.Chaplain will seek to follow-up. Spiritual care services available as needed.   Alda Ponder, Chaplain

## 2018-10-07 NOTE — ED Notes (Signed)
Father at the bedside   pts pupils uinchanged fixed and dilated  Pt does not respond to mother or fathers   Talking to him

## 2018-10-07 NOTE — Progress Notes (Addendum)
..   NAME:  Bobby Weaver, MRN:  [id number], DOB:  1996-04-22, LOS: 0 ADMISSION DATE:  09/23/2018, CONSULTATION DATE:  10/21/2018 REFERRING  MD:  Elliot Gurney MD, CHIEF COMPLAINT:  Post cardiac arrest   Brief History   23 yr old male w/ no PMHx sig but prev h/o drug use specifically heroin  presents s/p code unknown downtime, intubated by EMS >>ROSC post 2mg  Narcan and 2 Epi. PCCM asked to admit.     Per EMS patient went to go take a shower >>>10 mins later pt found to be down in the bathroom (no h/o hitting head or trauma reported) unclear downtime They called 911 and Dad started bystander CPR. EMS; , EMS arrived Pt was in asystole, EMS gave Narcan 2 mg, Epi x 2 and intubated; took 10-25 mins to arrive per Dad and pt's brother.   Past Medical History  none   Significant Hospital Events   5/16: S/p cardiac arrest. GCS 3, intubated 5/17 febrile over night. Worsening hypoxia requiring vent titration. CT head suggesting devastating anoxic injury.    Consults:  10/07/2018>>> PCCM  Procedures:  Endotracheal intubation with EMS  Significant Diagnostic Tests:    CTH:1. Diffuse brain edema with loss of CSF space and mild effacement of the foramen magnum. Loss of gray-white differentiation in the basal ganglia. Pseudo subarachnoid hemorrhage related to edema. 2. No fracture or subluxation of the cervical spine. 3. Enteric tube coiled in the pharynx, needs repositioning. 4. Incidental note of septal thickening and patchy ground-glass opacities in the lung apices suggesting pulmonary edema.  Micro Data:  10/07/2018    SARSCOV2>>>>>negative 10/07/2018    MRSA PCR>>>>>negative 5/17 sputum >>> Antimicrobials:  unasyn 5/17>>>  Interim history/subjective:  unresponsive  Objective   Blood pressure 104/81, pulse 100, temperature 98.1 F (36.7 C), resp. rate (Abnormal) 30, height 5\' 8"  (1.727 m), weight 67.9 kg, SpO2 100 %.    Vent Mode: PRVC FiO2 (%):  [30 %-100 %] 100 % Set Rate:  [20 bmp-30  bmp] 30 bmp Vt Set:  [540 mL-600 mL] 600 mL PEEP:  [5 cmH20-15 cmH20] 5 cmH20 Plateau Pressure:  [16 cmH20-35 cmH20] 35 cmH20   Intake/Output Summary (Last 24 hours) at 10/07/2018 0909 Last data filed at 10/07/2018 0000 Gross per 24 hour  Intake 1000 ml  Output 700 ml  Net 300 ml   Filed Weights   10/04/2018 2214 10/07/18 0500  Weight: 68 kg 67.9 kg    Examination: General remains unresponsive. Intermittent shivering HENT NCAT pupils 2 an unreactive, orally intubated  Pulm decreased bases has spont resp drive still. Currently on high FIO2 support Card RRR  abd soft + bowel sounds  Neuro GCS 3, eyes spont slightly open during shivering episodes. No cough, no gag no reflexes Ext warm and dry diaphoretic  gu clear yellow  Resolved issues    Assessment & Plan:   Cardiorespiratory Arrest- post resuscitation mgmt UDS positive for cocaine and amphetamines Deemed Not a candidate for TTM Plan: Cont supportive care Cont tele  MAP goal > 65  Acute hypoxic and hypercarbic Respiratory failure s/p cardiopulm arrest  -initial CXR was clear. But episodes of hypoxia at night.  -worsening hypoxia overnight suspect that he aspirated  Plan: F/u abg and Cont full vent support cxr now Sputum culture  VAP bundle Empiric unasyn   Acute Metabolic Encephalopathy Secondary to presumed overdose resultant Anoxic brain injury -has h/o polysubstance abuse -down time in excess of 30 minutes Plan: Cont supportive care F/u EEG Neurology consult  Avoiding BB given possible recent cocaine Cont folic acid and thiamine  Acute Kidney Injury: Anion Gap Metabolic Acidosis s/p prolonged cardiac arrest LA 9 to 4.7 Scr still elevated Mildly elevated CKs, started on bicarb gtt Plan: Cont current bicarb gtt at 125 ml/hr.  Serial chemistries  Strict intake and output   Fluid and electrolyte imbalance Plan Cont current IVFs Serial chemistries    Best practice:  Diet: NPO Pain/Anxiety/Delirium  protocol (if indicated): versed if needed VAP protocol (if indicated): indicated DVT prophylaxis: Heparin [place] with SCDs GI prophylaxis: pepcid Glucose control: if BG exceeds 180mg /dl will start ISS. No h/o DM Mobility: Bedrest Code Status: Full Family Communication: large family here in consultation room in ED, Dad mom, grandmothers, girlfriend(which is pregnant), siblings all present for conversation.  Disposition: ICU remains critically ill due to devastating anoxic brain injury after cardiac arrest. To date appears as though things have declined over night w/ on-going AKI, fever, and worsening hypoxia. I will titrate peep/fio2, repeat abg, get culture, pcxr and start empiric unasyn. Also asking for neuro consult. I suspect he will do poorly if he survives.     Critical care time: 43 minutes.    Simonne MartinetPeter E Babcock ACNP-BC [facility]/Critical [facility] Pager # [phone] OR # [phone] if no answer  PCCM Attestation: I have taken an interval history, reviewed the chart and examined the patient. I agree with the Advanced Practitioner's note, impression, and recommendations as outlined.   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 CPR was performed for 10-25 minutes before EMS arrive and patient found in asystole. Patient was given epi x 2 and narcan before achieving ROSC. [facility] was intubated and [facility]  On my exam, patient unresponsive on mechanical ventilation. Lung exam CTAB, no wheezing. Cardiac exam RRR, no murmurs. Extremities cool with palpable pulses.  Labs in last 24 hours reviewed per EMR.  Na 150 BUN/Cr 23/2.56 Troponin 1.04  CXR 5/17 Interval development of RML/RLL infiltrate CTH 5/16 Diffuse brain edema   Assessment/Plan 23 year old male with hx of polysubstance abuse found down for unknown time s/p PEA, now unresponsive with head imaging suggestive of anoxic brain injury. Will order EEG and consult Neurology  however I suspect is prognosis is grim and we have communicated with family. He continues to require mechanical ventilation for his mental status and  aspiration pneumonia. Plan to continue GOC discussion with family.   The patient is critically ill with multiple organ systems failure and requires high complexity decision making for assessment and support, frequent evaluation and titration of therapies, application of advanced monitoring technologies and extensive interpretation of multiple databases.   Critical Care Time devoted to patient care services described in this note is 38 Minutes. This time reflects time of care of this signee Dr. Mechele CollinJane Ellison. This critical care time does not reflect procedure time, or teaching time or supervisory time of PA/NP/Med student/Med Resident etc but could involve care discussion time.  Mechele CollinJane Ellison, M.D. [facility] Pager: [phone] After hours pager: [phone]

## 2018-10-07 NOTE — Progress Notes (Signed)
Rt attempted aline insertion on L side radial  x2 without success.  RT will continue to monitor.

## 2018-10-07 NOTE — Progress Notes (Signed)
Pharmacy Antibiotic Note  Bobby Weaver is a 23 y.o. male admitted on 10-11-2018 post cardiac arrest.  Pharmacy has been consulted for Unasyn dosing for aspiration PNA.  SCr 2.61, CrC 43 ml/min, Tmax 102.2, WBC 20.4, LA 4.7.  Plan: Unasyn 3gm IV Q6H Monitor renal fxn, clinical progress  Height: 5\' 8"  (172.7 cm) Weight: 149 lb 11.1 oz (67.9 kg) IBW/kg (Calculated) : 68.4  Temp (24hrs), Avg:100.4 F (38 C), Min:91.5 F (33.1 C), Max:103.5 F (39.7 C)  Recent Labs  Lab 10/11/2018 2217 10/07/18 0244 10/07/18 0600  WBC 14.5*  --  6.5  CREATININE 2.24* 2.92* 2.61*  LATICACIDVEN 9.0* 4.7*  --     Estimated Creatinine Clearance: 43 mL/min (A) (by C-G formula based on SCr of 2.61 mg/dL (H)).    No Known Allergies  Unasyn 5/17 >>  5/16 covid - negative 5/16 UCx -  5/17 MRSA PCR - negative 5/17 BCx -   Tristen Luce D. Laney Potash, PharmD, BCPS, BCCCP 10/07/2018, 10:50 AM

## 2018-10-07 NOTE — ED Notes (Signed)
Pt sats have been going up and down  Respiratory therapy at the bedside

## 2018-10-07 NOTE — ED Notes (Signed)
Bicarb drip requested from pharmacy

## 2018-10-07 NOTE — Progress Notes (Signed)
Per MD and ABG result

## 2018-10-07 NOTE — Procedures (Signed)
History: 23 year old male status post cardiac arrest due to drug overdose  Sedation: None  Technique: This is a 21 channel routine scalp EEG performed at the bedside with bipolar and monopolar montages arranged in accordance to the international 10/20 system of electrode placement. One channel was dedicated to EKG recording.    Background: The background consists of generalized suppression with intermittent bursts of high-voltage sharply contoured theta range activity lasting 1 to 2 seconds.  The interburst interval is 7 to 10 seconds.  Photic stimulation: Physiologic driving is not performed  EEG Abnormalities: 1) burst suppression EEG  Clinical Interpretation: This EEG is consistent with a profound cerebral dysfunction consistent with a severe anoxic brain injury.  In this clinical scenario, this is highly correlated with poor prognosis.  There was no seizure or seizure predisposition recorded on this study.   Ritta Slot, MD [facility] [phone]  If 7pm- 7am, please page neurology on call as listed in AMION.

## 2018-10-07 NOTE — ED Notes (Signed)
Critical care at  The bedside 

## 2018-10-07 NOTE — ED Provider Notes (Signed)
[facility] EMERGENCY DEPARTMENT Provider Note   CSN: [id number] Arrival date & time: 10/16/2018  2202    History   Chief Complaint Chief Complaint  Patient presents with   post cardiac arrest    HPI Bobby Weaver is a 23 y.o. male. With known heroin abuse presents to the ED after cardiac arrest. Per EMS patient went to take a shower and 40 minutes later family went to check on him and found him unresponsive. EMS was called and arrive approximately 5 minutes later. Initial rhythm was asystole. Unknown downtime. EMS performed CPR for 10 minutes with ROSC achieved after 2 rounds of epi and  of Narcan.     HPI  History reviewed. No pertinent past medical history.  Patient Active Problem List   Diagnosis Date Noted   Overdose 10/07/2018   Cardiac arrest [facility])    Endotracheally intubated    Metabolic acidosis     History reviewed. No pertinent surgical history.      Home Medications    Prior to Admission medications   Not on File    Family History No family history on file.  Social History Social History   Tobacco Use   Smoking status: Never Smoker   Smokeless tobacco: Never Used  Substance Use Topics   Alcohol use: Not on file   Drug use: Yes    Types: IV     Allergies   Patient has no known allergies.   Review of Systems Review of Systems  Unable to perform ROS: Intubated     Physical Exam Updated Vital Signs BP 99/79    Pulse (!) 109    Temp 98.8 F (37.1 C) (Core)    Resp (!) 30    Ht  (1.727 m)    Wt 67.9 kg    SpO2 100%    BMI 22.76 kg/m   Physical Exam Vitals signs and nursing note reviewed.  Constitutional:      General: He is in acute distress.     Appearance: He is well-developed. He is ill-appearing.  HENT:     Head: Normocephalic and atraumatic.  Eyes:     Comments: Pupils blown (6 and non-reactive)  Neck:     Musculoskeletal: Neck supple.  Cardiovascular:     Rate and Rhythm: Regular  rhythm. Tachycardia present.     Heart sounds: No murmur.  Pulmonary:     Effort: Respiratory distress (requiring mechanical ventilation) present.  Abdominal:     General: Abdomen is flat.     Palpations: Abdomen is soft.  Skin:    Findings: No bruising.     Comments: Cool, dry  Neurological:     Mental Status: He is unresponsive.     GCS: GCS eye subscore is 1. GCS verbal subscore is 1. GCS motor subscore is 1.     Comments: GCS 3T, no sedation      ED Treatments / Results  Labs (all labs ordered are listed, but only abnormal results are displayed) Labs Reviewed  CBC WITH DIFFERENTIAL/PLATELET - Abnormal; Notable for the following components:      Result Value   WBC 14.5 (*)    Lymphs Abs 7.2 (*)    Abs Immature Granulocytes 0.72 (*)    All other components within normal limits  COMPREHENSIVE METABOLIC PANEL - Abnormal; Notable for the following components:   Potassium 5.2 (*)    CO2 16 (*)    Glucose, Bld 347 (*)    Creatinine, Ser 2.24 (*)  Calcium 8.2 (*)    Total Protein 5.4 (*)    Albumin 3.1 (*)    AST 145 (*)    ALT 116 (*)    GFR calc non Af Amer 40 (*)    GFR calc Af Amer 47 (*)    Anion gap 19 (*)    All other components within normal limits  LACTIC ACID, PLASMA - Abnormal; Notable for the following components:   Lactic Acid, Venous 9.0 (*)    All other components within normal limits  ACETAMINOPHEN LEVEL - Abnormal; Notable for the following components:   Acetaminophen (Tylenol), Serum <10 (*)    All other components within normal limits  URINALYSIS, ROUTINE W REFLEX MICROSCOPIC - Abnormal; Notable for the following components:   APPearance HAZY (*)    Glucose, UA >=500 (*)    Hgb urine dipstick SMALL (*)    Protein, ur 100 (*)    Bacteria, UA RARE (*)    All other components within normal limits  RAPID URINE DRUG SCREEN, HOSP PERFORMED - Abnormal; Notable for the following components:   Cocaine POSITIVE (*)    Amphetamines POSITIVE (*)     Tetrahydrocannabinol POSITIVE (*)    All other components within normal limits  COMPREHENSIVE METABOLIC PANEL - Abnormal; Notable for the following components:   CO2 20 (*)    Glucose, Bld 199 (*)    Creatinine, Ser 2.92 (*)    Calcium 7.7 (*)    Total Protein 6.1 (*)    Albumin 3.3 (*)    AST 213 (*)    ALT 156 (*)    GFR calc non Af Amer 29 (*)    GFR calc Af Amer 34 (*)    Anion gap 16 (*)    All other components within normal limits  MAGNESIUM - Abnormal; Notable for the following components:   Magnesium 2.8 (*)    All other components within normal limits  PHOSPHORUS - Abnormal; Notable for the following components:   Phosphorus >30.0 (*)    All other components within normal limits  LACTIC ACID, PLASMA - Abnormal; Notable for the following components:   Lactic Acid, Venous 4.7 (*)    All other components within normal limits  GLUCOSE, CAPILLARY - Abnormal; Notable for the following components:   Glucose-Capillary 117 (*)    All other components within normal limits  BLOOD GAS, ARTERIAL - Abnormal; Notable for the following components:   pH, Arterial 7.015 (*)    pCO2 arterial 78.9 (*)    pO2, Arterial 155 (*)    Bicarbonate 18.4 (*)    Acid-base deficit 10.7 (*)    All other components within normal limits  BLOOD GAS, ARTERIAL - Abnormal; Notable for the following components:   pH, Arterial 7.209 (*)    pCO2 arterial 71.3 (*)    All other components within normal limits  CBC WITH DIFFERENTIAL/PLATELET - Abnormal; Notable for the following components:   RBC 6.28 (*)    Hemoglobin 19.3 (*)    HCT 59.4 (*)    All other components within normal limits  PHOSPHORUS - Abnormal; Notable for the following components:   Phosphorus 9.0 (*)    All other components within normal limits  CK - Abnormal; Notable for the following components:   Total CK 574 (*)    All other components within normal limits  BASIC METABOLIC PANEL - Abnormal; Notable for the following components:     Sodium 151 (*)    CO2 21 (*)  Glucose, Bld 101 (*)    Creatinine, Ser 2.61 (*)    Calcium 7.6 (*)    GFR calc non Af Amer 34 (*)    GFR calc Af Amer 39 (*)    Anion gap 22 (*)    All other components within normal limits  TROPONIN I - Abnormal; Notable for the following components:   Troponin I 1.04 (*)    All other components within normal limits  GLUCOSE, CAPILLARY - Abnormal; Notable for the following components:   Glucose-Capillary 105 (*)    All other components within normal limits  BASIC METABOLIC PANEL - Abnormal; Notable for the following components:   Sodium 150 (*)    BUN 23 (*)    Creatinine, Ser 2.56 (*)    Calcium 7.4 (*)    GFR calc non Af Amer 34 (*)    GFR calc Af Amer 40 (*)    Anion gap 19 (*)    All other components within normal limits  GLUCOSE, CAPILLARY - Abnormal; Notable for the following components:   Glucose-Capillary 117 (*)    All other components within normal limits  POCT I-STAT EG7 - Abnormal; Notable for the following components:   pH, Ven 6.951 (*)    pCO2, Ven 69.6 (*)    pO2, Ven 170.0 (*)    Bicarbonate 15.3 (*)    TCO2 17 (*)    Acid-base deficit 17.0 (*)    Calcium, Ion 1.02 (*)    All other components within normal limits  POCT I-STAT 7, (LYTES, BLD GAS, ICA,H+H) - Abnormal; Notable for the following components:   pH, Arterial 7.075 (*)    pCO2 arterial 67.1 (*)    pO2, Arterial 578.0 (*)    Bicarbonate 19.7 (*)    Acid-base deficit 11.0 (*)    Potassium 6.0 (*)    Calcium, Ion 1.08 (*)    All other components within normal limits  POCT I-STAT 7, (LYTES, BLD GAS, ICA,H+H) - Abnormal; Notable for the following components:   pH, Arterial 6.992 (*)    pCO2 arterial 72.6 (*)    pO2, Arterial 59.0 (*)    Bicarbonate 17.8 (*)    TCO2 20 (*)    Acid-base deficit 16.0 (*)    Calcium, Ion 1.02 (*)    HCT 60.0 (*)    Hemoglobin 20.4 (*)    All other components within normal limits  POCT I-STAT 7, (LYTES, BLD GAS, ICA,H+H) -  Abnormal; Notable for the following components:   pH, Arterial 6.982 (*)    pCO2 arterial 76.5 (*)    pO2, Arterial 61.0 (*)    Bicarbonate 18.4 (*)    TCO2 21 (*)    Acid-base deficit 16.0 (*)    Calcium, Ion 1.07 (*)    HCT 59.0 (*)    Hemoglobin 20.1 (*)    All other components within normal limits  POCT I-STAT 7, (LYTES, BLD GAS, ICA,H+H) - Abnormal; Notable for the following components:   pH, Arterial 7.088 (*)    pO2, Arterial 67.0 (*)    Bicarbonate 14.3 (*)    TCO2 16 (*)    Acid-base deficit 16.0 (*)    Calcium, Ion 1.04 (*)    HCT 60.0 (*)    Hemoglobin 20.4 (*)    All other components within normal limits  SARS CORONAVIRUS 2 (HOSPITAL ORDER, PERFORMED IN [facility])  MRSA PCR SCREENING  URINE CULTURE  CULTURE, BLOOD (ROUTINE X 2)  CULTURE, BLOOD (ROUTINE  X 2)  ETHANOL  SALICYLATE LEVEL  CORTISOL  MAGNESIUM  HIV ANTIBODY (ROUTINE TESTING W REFLEX)  CBC  CBC WITH DIFFERENTIAL/PLATELET  TROPONIN I  TROPONIN I  BASIC METABOLIC PANEL  I-STAT VENOUS BLOOD GAS, ED    EKG None  Radiology Dg Abdomen 1 View  Result Date: 09/28/2018 CLINICAL DATA:  Cardiopulmonary resuscitation after arrest. EXAM: ABDOMEN - 1 VIEW COMPARISON:  None. FINDINGS: Multiple lines and tubes overlie the abdomen. There is tip what may be a weighted enteric tube projecting over the stomach, but this is unclear. The stomach is markedly distended with gas. IMPRESSION: Markedly gas distended stomach without definite visualization of nasogastric tube. It is unclear whether the tubular structure overlying the gastric body is internal or external. Electronically Signed   By: Deatra Robinson M.D.   On: 10/10/2018 22:54   Ct Head Wo Contrast  Result Date: 10/07/2018 CLINICAL DATA:  Head trauma, minor, GCS>=13, high clinical risk, initial exam; C-spine trauma, high clinical risk (NEXUS/CCR). Found unresponsive in the shower, heroin use. EXAM: CT HEAD WITHOUT CONTRAST CT CERVICAL SPINE  WITHOUT CONTRAST TECHNIQUE: Multidetector CT imaging of the head and cervical spine was performed following the standard protocol without intravenous contrast. Multiplanar CT image reconstructions of the cervical spine were also generated. COMPARISON:  None. FINDINGS: CT HEAD FINDINGS Brain: Diffuse sulcal effacement and loss of CSF space. Lateral ventricles are slightly small. There is mild effacement of foramen magnum. Apparent increased density in subarachnoid spaces likely represents pseudo subarachnoid hemorrhage related to brain edema. Loss of gray-white differentiation in the basal ganglia. Gray-white differentiation of the cerebral hemispheres is preserved. No subdural or extra-axial collection. Vascular: Increased density of the MCAs and ACAs, likely secondary to cerebral edema. Skull: No fracture or focal lesion. Sinuses/Orbits: Mucosal thickening throughout the paranasal sinuses with fluid in the sphenoid sinus and maxillary sinuses, possibly related to intubation. Mastoid air cells are clear. Visualized orbits are unremarkable. Other: None. CT CERVICAL SPINE FINDINGS Alignment: Normal. Skull base and vertebrae: No acute fracture. Vertebral body heights are maintained. The dens and skull base are intact. Soft tissues and spinal canal: Fluid in the pharynx. Enteric tube is looped in the pharynx. No evidence of canal hematoma. Disc levels:  Preserved. Upper chest: Septal thickening and patchy ground-glass opacities suggesting pulmonary edema. Other: None. IMPRESSION: 1. Diffuse brain edema with loss of CSF space and mild effacement of the foramen magnum. Loss of gray-white differentiation in the basal ganglia. Pseudo subarachnoid hemorrhage related to edema. 2. No fracture or subluxation of the cervical spine. 3. Enteric tube coiled in the pharynx, needs repositioning. 4. Incidental note of septal thickening and patchy ground-glass opacities in the lung apices suggesting pulmonary edema. These results were  called by telephone at the time of interpretation on 10/07/2018 at 12:02 am to Dr. Blane Ohara , who verbally acknowledged these results. Electronically Signed   By: Narda Rutherford M.D.   On: 10/07/2018 00:02   Ct Cervical Spine Wo Contrast  Result Date: 10/07/2018 CLINICAL DATA:  Head trauma, minor, GCS>=13, high clinical risk, initial exam; C-spine trauma, high clinical risk (NEXUS/CCR). Found unresponsive in the shower, heroin use. EXAM: CT HEAD WITHOUT CONTRAST CT CERVICAL SPINE WITHOUT CONTRAST TECHNIQUE: Multidetector CT imaging of the head and cervical spine was performed following the standard protocol without intravenous contrast. Multiplanar CT image reconstructions of the cervical spine were also generated. COMPARISON:  None. FINDINGS: CT HEAD FINDINGS Brain: Diffuse sulcal effacement and loss of CSF space. Lateral ventricles are slightly small.  There is mild effacement of foramen magnum. Apparent increased density in subarachnoid spaces likely represents pseudo subarachnoid hemorrhage related to brain edema. Loss of gray-white differentiation in the basal ganglia. Gray-white differentiation of the cerebral hemispheres is preserved. No subdural or extra-axial collection. Vascular: Increased density of the MCAs and ACAs, likely secondary to cerebral edema. Skull: No fracture or focal lesion. Sinuses/Orbits: Mucosal thickening throughout the paranasal sinuses with fluid in the sphenoid sinus and maxillary sinuses, possibly related to intubation. Mastoid air cells are clear. Visualized orbits are unremarkable. Other: None. CT CERVICAL SPINE FINDINGS Alignment: Normal. Skull base and vertebrae: No acute fracture. Vertebral body heights are maintained. The dens and skull base are intact. Soft tissues and spinal canal: Fluid in the pharynx. Enteric tube is looped in the pharynx. No evidence of canal hematoma. Disc levels:  Preserved. Upper chest: Septal thickening and patchy ground-glass opacities  suggesting pulmonary edema. Other: None. IMPRESSION: 1. Diffuse brain edema with loss of CSF space and mild effacement of the foramen magnum. Loss of gray-white differentiation in the basal ganglia. Pseudo subarachnoid hemorrhage related to edema. 2. No fracture or subluxation of the cervical spine. 3. Enteric tube coiled in the pharynx, needs repositioning. 4. Incidental note of septal thickening and patchy ground-glass opacities in the lung apices suggesting pulmonary edema. These results were called by telephone at the time of interpretation on 10/07/2018 at 12:02 am to Dr. Blane Ohara , who verbally acknowledged these results. Electronically Signed   By: Narda Rutherford M.D.   On: 10/07/2018 00:02   Dg Chest Port 1 View  Result Date: 10/07/2018 CLINICAL DATA:  Followup drug overdose. EXAM: PORTABLE CHEST 1 VIEW COMPARISON:  Oct 06, 2018 FINDINGS: The ETT is in good position. The NG tube terminates in the stomach. No pneumothorax. Bilateral pulmonary infiltrates have developed, most focal in the right base. No other acute abnormalities. IMPRESSION: 1. Developing bilateral pulmonary infiltrates, most focal in the right base, worrisome for pneumonia or aspiration. 2. Support apparatus as above. Electronically Signed   By: Gerome Sam III M.D   On: 10/07/2018 12:23   Dg Chest Portable 1 View  Result Date: 10/03/2018 CLINICAL DATA:  Intubation EXAM: PORTABLE CHEST 1 VIEW COMPARISON:  None. FINDINGS: Endotracheal tube tip is at the level of the clavicular heads. Lungs are clear. Normal cardiomediastinal contours. IMPRESSION: ET tube tip at the level of the clavicular heads. Electronically Signed   By: Deatra Robinson M.D.   On: 10/20/2018 22:52    Procedures Procedures (including critical care time)  Medications Ordered in ED Medications  heparin injection 5,000 Units (5,000 Units Subcutaneous Not Given 10/07/18 0600)  famotidine (PEPCID) tablet 20 mg (20 mg Oral Given 10/07/18 0949)  ondansetron  (ZOFRAN) injection 4 mg (has no administration in time range)  acetaminophen (TYLENOL) tablet 650 mg (has no administration in time range)  sodium bicarbonate 150 mEq in dextrose 5 % 1,000 mL infusion ( Intravenous New Bag/Given 10/07/18 0345)  midazolam (VERSED) injection 2 mg (2 mg Intravenous Given 10/07/18 0231)  midazolam (VERSED) injection 2 mg (has no administration in time range)  docusate (COLACE) 50 MG/5ML liquid 100 mg (has no administration in time range)  0.9 %  sodium chloride infusion (has no administration in time range)  midazolam (VERSED) 2 MG/2ML injection (has no administration in time range)  gabapentin (NEURONTIN) 250 MG/5ML solution 100 mg (100 mg Per Tube Not Given 10/07/18 0600)  folic acid injection 1 mg (1 mg Intravenous Given 10/07/18 1150)  thiamine (B-1)  injection 100 mg (100 mg Intravenous Given 10/07/18 0948)  Ampicillin-Sulbactam (UNASYN) 3 g in sodium chloride 0.9 % 100 mL IVPB (3 g Intravenous New Bag/Given 10/07/18 1040)  Chlorhexidine Gluconate Cloth 2 % PADS 6 each (has no administration in time range)  sodium chloride 0.9 % bolus 1,000 mL (0 mLs Intravenous Stopped 09/24/2018 2309)  fentaNYL (SUBLIMAZE) injection 100 mcg (100 mcg Intravenous Given 10/07/2018 2341)  lactated ringers bolus 1,000 mL (1,000 mLs Intravenous Transfusing/Transfer 10/07/18 0259)  acetaminophen (TYLENOL) suppository 650 mg (650 mg Rectal Given 10/07/18 0251)  sodium bicarbonate injection 200 mEq (200 mEq Intravenous Given 10/07/18 0428)  sodium bicarbonate 1 mEq/mL injection (  Duplicate 10/07/18 0437)     Initial Impression / Assessment and Plan / ED Course  I have reviewed the triage vital signs and the nursing notes.  Pertinent labs & imaging results that were available during my care of the patient were reviewed by me and considered in my medical decision making (see chart for details).    Bobby Weaver is a 23 y.o. male. With known heroin abuse presents to the ED after cardiac arrest.  Per EMS patient went to take a shower and 40 minutes later family went to check on him and found him unresponsive. EMS was called and arrive approximately 5 minutes later. Initial rhythm was asystole. Unknown downtime. EMS performed CPR for 10 minutes with ROSC achieved after 2 rounds of epi and  of Narcan.  On arrival rosc obtained. Patient is tachycardic and maintaining blood pressures. ET tube confirmed with color change and bilateral breath sounds and portable CXR.  CT head showed Diffuse brain edema with loss of CSF space and mild effacement of the foramen magnum. Loss of gray-white differentiation in the basal ganglia. Pseudo subarachnoid hemorrhage related to edema.  CXR unremarkable.   Labs significant for AKI likely in the setting or shock/cardiac arrest. Lactic acid 9. Tylenol and salicylates negative. COVID negative.  Attending Dr. Jodi Mourning spoke with family and updated them.   Patient will be admitted to the ED.     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

## 2018-10-08 ENCOUNTER — Inpatient Hospital Stay (HOSPITAL_COMMUNITY): Payer: BLUE CROSS/BLUE SHIELD

## 2018-10-08 DIAGNOSIS — G936 Cerebral edema: Secondary | ICD-10-CM

## 2018-10-08 DIAGNOSIS — J9601 Acute respiratory failure with hypoxia: Secondary | ICD-10-CM

## 2018-10-08 DIAGNOSIS — Z529 Donor of unspecified organ or tissue: Secondary | ICD-10-CM

## 2018-10-08 DIAGNOSIS — R55 Syncope and collapse: Secondary | ICD-10-CM

## 2018-10-08 DIAGNOSIS — Z7189 Other specified counseling: Secondary | ICD-10-CM

## 2018-10-08 DIAGNOSIS — G931 Anoxic brain damage, not elsewhere classified: Secondary | ICD-10-CM

## 2018-10-08 LAB — BLOOD GAS, ARTERIAL
Acid-Base Excess: 7.5 mmol/L — ABNORMAL HIGH (ref 0.0–2.0)
Acid-Base Excess: 8.2 mmol/L — ABNORMAL HIGH (ref 0.0–2.0)
Bicarbonate: 33.6 mmol/L — ABNORMAL HIGH (ref 20.0–28.0)
Bicarbonate: 33.9 mmol/L — ABNORMAL HIGH (ref 20.0–28.0)
Drawn by: 362771
Drawn by: 36277
FIO2: 100
FIO2: 100
O2 Saturation: 99.4 %
O2 Saturation: 99.3 %
PEEP: 15 cmH2O
PEEP: 15 cmH2O
Patient temperature: 102
Patient temperature: 103.8
RATE: 30 resp/min
RATE: 30 resp/min
pCO2 arterial: 75.1 mmHg (ref 32.0–48.0)
pCO2 arterial: 74.2 mmHg (ref 32.0–48.0)
pH, Arterial: 7.285 — ABNORMAL LOW (ref 7.350–7.450)
pH, Arterial: 7.299 — ABNORMAL LOW (ref 7.350–7.450)
pO2, Arterial: 389 mmHg — ABNORMAL HIGH (ref 83.0–108.0)
pO2, Arterial: 350 mmHg — ABNORMAL HIGH (ref 83.0–108.0)

## 2018-10-08 LAB — POCT I-STAT 7, (LYTES, BLD GAS, ICA,H+H)
Acid-Base Excess: 16 mmol/L — ABNORMAL HIGH (ref 0.0–2.0)
Bicarbonate: 36.8 mmol/L — ABNORMAL HIGH (ref 20.0–28.0)
Calcium, Ion: 0.76 mmol/L — CL (ref 1.15–1.40)
HCT: 40 % (ref 39.0–52.0)
Hemoglobin: 13.6 g/dL (ref 13.0–17.0)
O2 Saturation: 91 %
Patient temperature: 102
Potassium: 2.7 mmol/L — CL (ref 3.5–5.1)
Sodium: 145 mmol/L (ref 135–145)
TCO2: 38 mmol/L — ABNORMAL HIGH (ref 22–32)
pCO2 arterial: 32.5 mmHg (ref 32.0–48.0)
pH, Arterial: 7.666 (ref 7.350–7.450)
pO2, Arterial: 52 mmHg — ABNORMAL LOW (ref 83.0–108.0)

## 2018-10-08 LAB — URINE CULTURE: Culture: NO GROWTH

## 2018-10-08 LAB — URINALYSIS, ROUTINE W REFLEX MICROSCOPIC
Bacteria, UA: NONE SEEN
Bilirubin Urine: NEGATIVE
Glucose, UA: NEGATIVE mg/dL
Ketones, ur: NEGATIVE mg/dL
Leukocytes,Ua: NEGATIVE
Nitrite: NEGATIVE
Protein, ur: NEGATIVE mg/dL
Specific Gravity, Urine: 1.004 — ABNORMAL LOW (ref 1.005–1.030)
pH: 9 — ABNORMAL HIGH (ref 5.0–8.0)

## 2018-10-08 LAB — ABO/RH: ABO/RH(D): O POS

## 2018-10-08 LAB — ECHOCARDIOGRAM COMPLETE
Height: 68 in
Weight: 2395.08 oz

## 2018-10-08 LAB — BASIC METABOLIC PANEL
Anion gap: 15 (ref 5–15)
BUN: 33 mg/dL — ABNORMAL HIGH (ref 6–20)
CO2: 43 mmol/L — ABNORMAL HIGH (ref 22–32)
Calcium: 4.8 mg/dL — CL (ref 8.9–10.3)
Chloride: 88 mmol/L — ABNORMAL LOW (ref 98–111)
Creatinine, Ser: 2.29 mg/dL — ABNORMAL HIGH (ref 0.61–1.24)
GFR calc Af Amer: 46 mL/min — ABNORMAL LOW (ref >60)
GFR calc non Af Amer: 39 mL/min — ABNORMAL LOW (ref >60)
Glucose, Bld: 475 mg/dL — ABNORMAL HIGH (ref 70–99)
Potassium: 2.8 mmol/L — ABNORMAL LOW (ref 3.5–5.1)
Sodium: 146 mmol/L — ABNORMAL HIGH (ref 135–145)

## 2018-10-08 LAB — COMPREHENSIVE METABOLIC PANEL
ALT: 75 U/L — ABNORMAL HIGH (ref 0–44)
AST: 150 U/L — ABNORMAL HIGH (ref 15–41)
Albumin: 2 g/dL — ABNORMAL LOW (ref 3.5–5.0)
Alkaline Phosphatase: 35 U/L — ABNORMAL LOW (ref 38–126)
Anion gap: 16 — ABNORMAL HIGH (ref 5–15)
BUN: 35 mg/dL — ABNORMAL HIGH (ref 6–20)
CO2: 47 mmol/L — ABNORMAL HIGH (ref 22–32)
Calcium: 5.2 mg/dL — CL (ref 8.9–10.3)
Chloride: 85 mmol/L — ABNORMAL LOW (ref 98–111)
Creatinine, Ser: 2.39 mg/dL — ABNORMAL HIGH (ref 0.61–1.24)
GFR calc Af Amer: 43 mL/min — ABNORMAL LOW (ref >60)
GFR calc non Af Amer: 37 mL/min — ABNORMAL LOW (ref >60)
Glucose, Bld: 556 mg/dL (ref 70–99)
Potassium: 2.7 mmol/L — CL (ref 3.5–5.1)
Sodium: 148 mmol/L — ABNORMAL HIGH (ref 135–145)
Total Bilirubin: 0.7 mg/dL (ref 0.3–1.2)
Total Protein: 4.3 g/dL — ABNORMAL LOW (ref 6.5–8.1)

## 2018-10-08 LAB — FIBRINOGEN: Fibrinogen: 613 mg/dL — ABNORMAL HIGH (ref 210–475)

## 2018-10-08 LAB — TROPONIN I: Troponin I: 0.44 ng/mL (ref <0.03)

## 2018-10-08 LAB — TYPE AND SCREEN
ABO/RH(D): O POS
Antibody Screen: NEGATIVE

## 2018-10-08 LAB — PROTIME-INR
INR: 1.9 — ABNORMAL HIGH (ref 0.8–1.2)
Prothrombin Time: 21.8 seconds — ABNORMAL HIGH (ref 11.4–15.2)

## 2018-10-08 LAB — AMYLASE: Amylase: 65 U/L (ref 28–100)

## 2018-10-08 LAB — BILIRUBIN, DIRECT: Bilirubin, Direct: <0.1 mg/dL (ref 0.0–0.2)

## 2018-10-08 LAB — LACTIC ACID, PLASMA
Lactic Acid, Venous: 2.9 mmol/L (ref 0.5–1.9)
Lactic Acid, Venous: 3.6 mmol/L (ref 0.5–1.9)

## 2018-10-08 LAB — MAGNESIUM: Magnesium: 1.3 mg/dL — ABNORMAL LOW (ref 1.7–2.4)

## 2018-10-08 LAB — GLUCOSE, CAPILLARY
Glucose-Capillary: 132 mg/dL — ABNORMAL HIGH (ref 70–99)
Glucose-Capillary: 171 mg/dL — ABNORMAL HIGH (ref 70–99)
Glucose-Capillary: 122 mg/dL — ABNORMAL HIGH (ref 70–99)
Glucose-Capillary: 166 mg/dL — ABNORMAL HIGH (ref 70–99)

## 2018-10-08 LAB — CK TOTAL AND CKMB (NOT AT ARMC)
CK, MB: 13.2 ng/mL — ABNORMAL HIGH (ref 0.5–5.0)
Relative Index: 0.3 (ref 0.0–2.5)
Total CK: 4358 U/L — ABNORMAL HIGH (ref 49–397)

## 2018-10-08 LAB — HEMOGLOBIN A1C
Hgb A1c MFr Bld: 5.1 % (ref 4.8–5.6)
Mean Plasma Glucose: 99.67 mg/dL

## 2018-10-08 LAB — LIPASE, BLOOD: Lipase: 21 U/L (ref 11–51)

## 2018-10-08 LAB — SARS CORONAVIRUS 2 BY RT PCR (HOSPITAL ORDER, PERFORMED IN ~~LOC~~ HOSPITAL LAB): SARS Coronavirus 2: NEGATIVE

## 2018-10-08 LAB — PHOSPHORUS: Phosphorus: 1.7 mg/dL — ABNORMAL LOW (ref 2.5–4.6)

## 2018-10-08 LAB — PATHOLOGIST SMEAR REVIEW: Path Review: INCREASED

## 2018-10-08 MED ORDER — GLYCOPYRROLATE 0.2 MG/ML IJ SOLN
0.2000 mg | Freq: Four times a day (QID) | INTRAMUSCULAR | Status: DC
  Administered 2018-10-08: 0.2 mg via INTRAVENOUS
  Filled 2018-10-08: qty 1

## 2018-10-08 MED ORDER — MAGNESIUM SULFATE 2 GM/50ML IV SOLN
2.0000 g | Freq: Once | INTRAVENOUS | Status: AC
  Administered 2018-10-08: 2 g via INTRAVENOUS
  Filled 2018-10-08: qty 50

## 2018-10-08 MED ORDER — SODIUM BICARBONATE 8.4 % IV SOLN
INTRAVENOUS | Status: AC
  Filled 2018-10-08: qty 50

## 2018-10-08 MED ORDER — CALCIUM GLUCONATE-NACL 2-0.675 GM/100ML-% IV SOLN
2.0000 g | Freq: Once | INTRAVENOUS | Status: AC
  Administered 2018-10-08: 2000 mg via INTRAVENOUS
  Filled 2018-10-08 (×3): qty 100

## 2018-10-08 MED ORDER — EPINEPHRINE 1 MG/10ML IJ SOSY
PREFILLED_SYRINGE | INTRAMUSCULAR | Status: AC
  Filled 2018-10-08: qty 10

## 2018-10-08 MED ORDER — SODIUM CHLORIDE 0.9 % IV SOLN
2000.0000 mg | Freq: Once | INTRAVENOUS | Status: AC
  Administered 2018-10-08: 2000 mg via INTRAVENOUS
  Filled 2018-10-08: qty 16

## 2018-10-08 MED ORDER — VASOPRESSIN 20 UNIT/ML IV SOLN
0.4000 [IU]/h | INTRAVENOUS | Status: DC
  Administered 2018-10-08: 0.4 [IU]/h via INTRAVENOUS
  Filled 2018-10-08: qty 1

## 2018-10-08 MED ORDER — LACTATED RINGERS IV BOLUS
1000.0000 mL | Freq: Once | INTRAVENOUS | Status: AC
  Administered 2018-10-08: 1000 mL via INTRAVENOUS

## 2018-10-08 MED ORDER — FAMOTIDINE 20 MG PO TABS
20.0000 mg | ORAL_TABLET | Freq: Two times a day (BID) | ORAL | Status: DC
  Administered 2018-10-08 – 2018-10-09 (×2): 20 mg
  Filled 2018-10-08 (×2): qty 1

## 2018-10-08 MED ORDER — INSULIN NPH (HUMAN) (ISOPHANE) 100 UNIT/ML ~~LOC~~ SUSP
20.0000 [IU] | Freq: Once | SUBCUTANEOUS | Status: AC
  Administered 2018-10-08: 20 [IU] via SUBCUTANEOUS
  Filled 2018-10-08: qty 10

## 2018-10-08 MED ORDER — PIPERACILLIN-TAZOBACTAM 3.375 G IVPB
3.3750 g | INTRAVENOUS | Status: DC
  Filled 2018-10-08: qty 50

## 2018-10-08 MED ORDER — SODIUM PHOSPHATES 45 MMOLE/15ML IV SOLN
20.0000 mmol | Freq: Once | INTRAVENOUS | Status: AC
  Administered 2018-10-08: 21:00:00 20 mmol via INTRAVENOUS
  Filled 2018-10-08: qty 6.67

## 2018-10-08 MED ORDER — DEXTROSE 50 % IV SOLN
1.0000 | Freq: Once | INTRAVENOUS | Status: AC
  Administered 2018-10-08: 50 mL via INTRAVENOUS
  Filled 2018-10-08 (×3): qty 50

## 2018-10-08 MED ORDER — ATROPINE SULFATE 1 MG/10ML IJ SOSY
PREFILLED_SYRINGE | INTRAMUSCULAR | Status: AC
  Filled 2018-10-08: qty 10

## 2018-10-08 MED ORDER — SODIUM CHLORIDE 0.9 % IV SOLN
10.0000 ug/h | INTRAVENOUS | Status: DC
  Administered 2018-10-08: 10 ug/h via INTRAVENOUS
  Filled 2018-10-08: qty 10

## 2018-10-08 MED ORDER — TECHNETIUM TC 99M EXAMETAZIME IV KIT
19.2000 | PACK | Freq: Once | INTRAVENOUS | Status: AC | PRN
  Administered 2018-10-08: 19.2 via INTRAVENOUS

## 2018-10-08 MED ORDER — POTASSIUM CHLORIDE 10 MEQ/50ML IV SOLN
10.0000 meq | INTRAVENOUS | Status: AC
  Administered 2018-10-08 – 2018-10-09 (×6): 10 meq via INTRAVENOUS
  Filled 2018-10-08 (×7): qty 50

## 2018-10-08 MED ORDER — ACETAMINOPHEN 325 MG PO TABS: 650.0000 mg | ORAL_TABLET | ORAL | Status: DC | PRN

## 2018-10-08 MED ORDER — LACTATED RINGERS IV SOLN
INTRAVENOUS | Status: DC
  Administered 2018-10-08 – 2018-10-09 (×4): via INTRAVENOUS

## 2018-10-08 MED ORDER — VANCOMYCIN HCL IN DEXTROSE 1-5 GM/200ML-% IV SOLN
1000.0000 mg | INTRAVENOUS | Status: DC
  Filled 2018-10-08: qty 200

## 2018-10-08 MED ORDER — LEVOTHYROXINE SODIUM 100 MCG/5ML IV SOLN
20.0000 ug | Freq: Once | INTRAVENOUS | Status: AC
  Administered 2018-10-08: 20 ug via INTRAVENOUS
  Filled 2018-10-08: qty 5

## 2018-10-09 ENCOUNTER — Inpatient Hospital Stay (HOSPITAL_COMMUNITY): Payer: BLUE CROSS/BLUE SHIELD

## 2018-10-09 DIAGNOSIS — Z529 Donor of unspecified organ or tissue: Secondary | ICD-10-CM | POA: Diagnosis not present

## 2018-10-09 LAB — COMPREHENSIVE METABOLIC PANEL
ALT: 86 U/L — ABNORMAL HIGH (ref 0–44)
ALT: 82 U/L — ABNORMAL HIGH (ref 0–44)
AST: 171 U/L — ABNORMAL HIGH (ref 15–41)
AST: 154 U/L — ABNORMAL HIGH (ref 15–41)
Albumin: 2.3 g/dL — ABNORMAL LOW (ref 3.5–5.0)
Albumin: 2.3 g/dL — ABNORMAL LOW (ref 3.5–5.0)
Alkaline Phosphatase: 42 U/L (ref 38–126)
Alkaline Phosphatase: 42 U/L (ref 38–126)
Anion gap: 14 (ref 5–15)
Anion gap: 12 (ref 5–15)
BUN: 34 mg/dL — ABNORMAL HIGH (ref 6–20)
BUN: 26 mg/dL — ABNORMAL HIGH (ref 6–20)
CO2: 31 mmol/L (ref 22–32)
CO2: 32 mmol/L (ref 22–32)
Calcium: 7.3 mg/dL — ABNORMAL LOW (ref 8.9–10.3)
Calcium: 7.2 mg/dL — ABNORMAL LOW (ref 8.9–10.3)
Chloride: 102 mmol/L (ref 98–111)
Chloride: 106 mmol/L (ref 98–111)
Creatinine, Ser: 1.96 mg/dL — ABNORMAL HIGH (ref 0.61–1.24)
Creatinine, Ser: 1.67 mg/dL — ABNORMAL HIGH (ref 0.61–1.24)
GFR calc Af Amer: 55 mL/min — ABNORMAL LOW (ref >60)
GFR calc Af Amer: >60 mL/min (ref >60)
GFR calc non Af Amer: 47 mL/min — ABNORMAL LOW (ref >60)
GFR calc non Af Amer: 58 mL/min — ABNORMAL LOW (ref >60)
Glucose, Bld: 167 mg/dL — ABNORMAL HIGH (ref 70–99)
Glucose, Bld: 177 mg/dL — ABNORMAL HIGH (ref 70–99)
Potassium: 2.9 mmol/L — ABNORMAL LOW (ref 3.5–5.1)
Potassium: 3.1 mmol/L — ABNORMAL LOW (ref 3.5–5.1)
Sodium: 147 mmol/L — ABNORMAL HIGH (ref 135–145)
Sodium: 150 mmol/L — ABNORMAL HIGH (ref 135–145)
Total Bilirubin: 0.8 mg/dL (ref 0.3–1.2)
Total Bilirubin: 0.8 mg/dL (ref 0.3–1.2)
Total Protein: 5.1 g/dL — ABNORMAL LOW (ref 6.5–8.1)
Total Protein: 5.1 g/dL — ABNORMAL LOW (ref 6.5–8.1)

## 2018-10-09 LAB — PROTIME-INR
INR: 1.5 — ABNORMAL HIGH (ref 0.8–1.2)
Prothrombin Time: 17.7 seconds — ABNORMAL HIGH (ref 11.4–15.2)

## 2018-10-09 LAB — PHOSPHORUS: Phosphorus: 3.5 mg/dL (ref 2.5–4.6)

## 2018-10-09 LAB — CK TOTAL AND CKMB (NOT AT ARMC)
CK, MB: 10.4 ng/mL — ABNORMAL HIGH (ref 0.5–5.0)
Relative Index: 0.2 (ref 0.0–2.5)
Total CK: 5770 U/L — ABNORMAL HIGH (ref 49–397)

## 2018-10-09 LAB — CBC WITH DIFFERENTIAL/PLATELET
Abs Immature Granulocytes: 0.1 10*3/uL — ABNORMAL HIGH (ref 0.00–0.07)
Basophils Absolute: 0 10*3/uL (ref 0.0–0.1)
Basophils Relative: 0 %
Eosinophils Absolute: 0 10*3/uL (ref 0.0–0.5)
Eosinophils Relative: 0 %
HCT: 40.1 % (ref 39.0–52.0)
Hemoglobin: 13.5 g/dL (ref 13.0–17.0)
Immature Granulocytes: 2 %
Lymphocytes Relative: 11 %
Lymphs Abs: 0.8 10*3/uL (ref 0.7–4.0)
MCH: 30.1 pg (ref 26.0–34.0)
MCHC: 33.7 g/dL (ref 30.0–36.0)
MCV: 89.3 fL (ref 80.0–100.0)
Monocytes Absolute: 0.1 10*3/uL (ref 0.1–1.0)
Monocytes Relative: 2 %
Neutro Abs: 5.9 10*3/uL (ref 1.7–7.7)
Neutrophils Relative %: 85 %
Platelets: 142 10*3/uL — ABNORMAL LOW (ref 150–400)
RBC: 4.49 MIL/uL (ref 4.22–5.81)
RDW: 12.6 % (ref 11.5–15.5)
WBC: 6.9 10*3/uL (ref 4.0–10.5)
nRBC: 0 % (ref 0.0–0.2)

## 2018-10-09 LAB — URINE CULTURE: Culture: NO GROWTH

## 2018-10-09 LAB — POCT I-STAT 7, (LYTES, BLD GAS, ICA,H+H)
Acid-Base Excess: 12 mmol/L — ABNORMAL HIGH (ref 0.0–2.0)
Acid-Base Excess: 13 mmol/L — ABNORMAL HIGH (ref 0.0–2.0)
Acid-Base Excess: 18 mmol/L — ABNORMAL HIGH (ref 0.0–2.0)
Bicarbonate: 34.2 mmol/L — ABNORMAL HIGH (ref 20.0–28.0)
Bicarbonate: 38.2 mmol/L — ABNORMAL HIGH (ref 20.0–28.0)
Bicarbonate: 38.7 mmol/L — ABNORMAL HIGH (ref 20.0–28.0)
Calcium, Ion: 0.94 mmol/L — ABNORMAL LOW (ref 1.15–1.40)
Calcium, Ion: 0.96 mmol/L — ABNORMAL LOW (ref 1.15–1.40)
Calcium, Ion: 0.99 mmol/L — ABNORMAL LOW (ref 1.15–1.40)
HCT: 35 % — ABNORMAL LOW (ref 39.0–52.0)
HCT: 36 % — ABNORMAL LOW (ref 39.0–52.0)
HCT: 36 % — ABNORMAL LOW (ref 39.0–52.0)
Hemoglobin: 11.9 g/dL — ABNORMAL LOW (ref 13.0–17.0)
Hemoglobin: 12.2 g/dL — ABNORMAL LOW (ref 13.0–17.0)
Hemoglobin: 12.2 g/dL — ABNORMAL LOW (ref 13.0–17.0)
O2 Saturation: 100 %
O2 Saturation: 100 %
O2 Saturation: 97 %
Patient temperature: 36.7
Patient temperature: 36.94
Patient temperature: 37.1
Potassium: 2.5 mmol/L — CL (ref 3.5–5.1)
Potassium: 2.9 mmol/L — ABNORMAL LOW (ref 3.5–5.1)
Potassium: 2.9 mmol/L — ABNORMAL LOW (ref 3.5–5.1)
Sodium: 146 mmol/L — ABNORMAL HIGH (ref 135–145)
Sodium: 146 mmol/L — ABNORMAL HIGH (ref 135–145)
Sodium: 146 mmol/L — ABNORMAL HIGH (ref 135–145)
TCO2: 35 mmol/L — ABNORMAL HIGH (ref 22–32)
TCO2: 40 mmol/L — ABNORMAL HIGH (ref 22–32)
TCO2: 40 mmol/L — ABNORMAL HIGH (ref 22–32)
pCO2 arterial: 29.8 mmHg — ABNORMAL LOW (ref 32.0–48.0)
pCO2 arterial: 36.3 mmHg (ref 32.0–48.0)
pCO2 arterial: 47 mmHg (ref 32.0–48.0)
pH, Arterial: 7.517 — ABNORMAL HIGH (ref 7.350–7.450)
pH, Arterial: 7.582 — ABNORMAL HIGH (ref 7.350–7.450)
pH, Arterial: 7.722 (ref 7.350–7.450)
pO2, Arterial: 536 mmHg — ABNORMAL HIGH (ref 83.0–108.0)
pO2, Arterial: 537 mmHg — ABNORMAL HIGH (ref 83.0–108.0)
pO2, Arterial: 80 mmHg — ABNORMAL LOW (ref 83.0–108.0)

## 2018-10-09 LAB — URINALYSIS, ROUTINE W REFLEX MICROSCOPIC
Bacteria, UA: NONE SEEN
Bilirubin Urine: NEGATIVE
Glucose, UA: NEGATIVE mg/dL
Ketones, ur: NEGATIVE mg/dL
Leukocytes,Ua: NEGATIVE
Nitrite: NEGATIVE
Protein, ur: NEGATIVE mg/dL
Specific Gravity, Urine: 1.009 (ref 1.005–1.030)
pH: 7 (ref 5.0–8.0)

## 2018-10-09 LAB — ECHOCARDIOGRAM COMPLETE
Height: 68 in
Weight: 2574.97 oz

## 2018-10-09 LAB — GLUCOSE, CAPILLARY: Glucose-Capillary: 167 mg/dL — ABNORMAL HIGH (ref 70–99)

## 2018-10-09 LAB — APTT: aPTT: 34 seconds (ref 24–36)

## 2018-10-09 LAB — MAGNESIUM: Magnesium: 2.2 mg/dL (ref 1.7–2.4)

## 2018-10-09 LAB — TROPONIN I: Troponin I: 0.25 ng/mL (ref <0.03)

## 2018-10-09 MED ORDER — LACTATED RINGERS IV BOLUS
500.0000 mL | Freq: Once | INTRAVENOUS | Status: AC
  Administered 2018-10-09: 500 mL via INTRAVENOUS

## 2018-10-09 MED ORDER — VANCOMYCIN HCL IN DEXTROSE 1-5 GM/200ML-% IV SOLN
1000.0000 mg | Freq: Once | INTRAVENOUS | Status: DC
  Filled 2018-10-09: qty 200

## 2018-10-09 MED ORDER — SODIUM CHLORIDE 0.9 % IV SOLN
2.0000 g | Freq: Once | INTRAVENOUS | Status: AC
  Administered 2018-10-09: 2 g via INTRAVENOUS
  Filled 2018-10-09: qty 2

## 2018-10-09 MED ORDER — SODIUM CHLORIDE 0.9 % IV SOLN
200.0000 mg | Freq: Once | INTRAVENOUS | Status: DC
  Filled 2018-10-09: qty 200

## 2018-10-09 MED ORDER — HYDRALAZINE HCL 20 MG/ML IJ SOLN
INTRAMUSCULAR | Status: AC
  Filled 2018-10-09: qty 1

## 2018-10-09 MED ORDER — HYDRALAZINE HCL 20 MG/ML IJ SOLN
10.0000 mg | Freq: Once | INTRAMUSCULAR | Status: AC
  Administered 2018-10-09: 10 mg via INTRAVENOUS

## 2018-10-09 MED ORDER — METOPROLOL TARTRATE 5 MG/5ML IV SOLN
5.0000 mg | Freq: Once | INTRAVENOUS | Status: AC
  Administered 2018-10-09: 5 mg via INTRAVENOUS

## 2018-10-09 MED ORDER — LABETALOL HCL 5 MG/ML IV SOLN
5.0000 mg | Freq: Once | INTRAVENOUS | Status: AC
  Administered 2018-10-09: 5 mg via INTRAVENOUS
  Filled 2018-10-09: qty 4

## 2018-10-09 MED ORDER — METOPROLOL TARTRATE 5 MG/5ML IV SOLN
INTRAVENOUS | Status: AC
  Filled 2018-10-09: qty 5

## 2018-10-09 NOTE — Progress Notes (Signed)
Pts labs back, abnormal potassium and calcium.  Relayed to [employer], RN [facility].

## 2018-10-09 NOTE — Progress Notes (Signed)
   22-Oct-2018 1158  Clinical Encounter Type  Visited With Patient and family together  Visit Type Follow-up;Spiritual support  Referral From Palliative care team;Chaplain  Consult/Referral To Chaplain  Spiritual Encounters  Spiritual Needs Grief support;Emotional  The chaplain read the Pt. Chart and was updated by the Pt. RN-Jessie.  This chaplain provided F/U spiritual care and pastoral presence for the Pt. family.  This chaplain consulted Spiritual Care Director-BH on the process for Pt. paternal DNA testing with consent.  The RN informed the chaplain the desire for Pt. Paternal DNA testing was discussed with the Pt. MD-Sood. The RN will F/U with appropriate next steps on DNA testing. The Pt. brother will visit the Pt. today at 2:30. The chaplain understands CDS will arrive today at 5pm for a 10pm OR. Spiritual care will continue to F/U as needed.

## 2018-10-09 NOTE — Progress Notes (Signed)
This note also relates to the following rows which could not be included: SpO2 - Cannot attach notes to unvalidated device data  Vent changes made per verbal order from CDS post ABG. Repeat ABG in one hour.

## 2018-10-09 NOTE — Progress Notes (Addendum)
Spoke w/ Porfirio Oar, Medical Examiner's office, who states pt will be transported back to [facility] after organ donation and at that time [facility] will examine pt.

## 2018-10-09 NOTE — Progress Notes (Addendum)
Spoke w/ chaplain's office re: Need to notarize permission letter from patient's mother to release pts blood to [facility] for DNA testing.

## 2018-10-09 NOTE — Progress Notes (Signed)
This note also relates to the following rows which could not be included: SpO2 - Cannot attach notes to unvalidated device data  Vent changes made per ABG/CDS orders.

## 2018-10-09 NOTE — Progress Notes (Signed)
  Echocardiogram Echocardiogram Transesophageal has been performed.  Leta Jungling M 10/16/2018, 3:55 PM

## 2018-10-09 NOTE — Progress Notes (Signed)
Report given to Barbara Cower, RN [facility] who will be caring for pt during transfer to [facility].

## 2018-10-09 NOTE — Progress Notes (Signed)
  Echocardiogram 2D Echocardiogram has been performed.  Bobby Weaver M 10/04/2018, 7:54 AM

## 2018-10-09 NOTE — Progress Notes (Addendum)
DNA blood specimen collected in purple top tube by RN.  Copy of notarized permission letter from patient's mother to release pts blood to [facility] for DNA testing put in bag w/ tube and walked down to main lab by RN.  This RN explained to lab tech that blood needs to be kept in fridge overnight and will be picked up by [facility] charge RN tomorrow.  [facility] to send box for blood sample to [facility] w/ instructions for sending back to [facility]. Notarized letter will be sent w/ blood sample.   [facility] arrived on unit at 1640 to transport pt to [facility] for organ donation.  Pt's mother allowed to walk down to ED loading area w/ patient along w/ this RN, Orthoptist, and Printmaker.

## 2018-10-09 NOTE — Progress Notes (Signed)
Chaplain responded to referral to patient's family.  [facility] was active with family when chaplain arrived. Chaplain provided emotional and spiritual support to mother, aunt, and pt's fiancee.  Chaplain offered prayer and escorted mother and her son to the ED where he was taken to [facility] to give the gift of life.  Chaplain escorted mother to the parking area to meet other family members. Rev. Lynnell Chad Pager [phone]

## 2018-10-11 NOTE — Progress Notes (Deleted)
23 yo male found unresponsive at home in cardiac arrest with asystole.  Had ROSC after more than 30 minutes.  UDS positive for cocaine, amphetamines, THC.  No significant past medical history.  Treated for aspiration pneumonia.  CT head showed changes of anoxic brain injury.  Neurology and nephrology consulted.  COVID negative.  Clinical exam consistent with brain death on 25-Oct-2018.  Confirmed with cerebral perfusion scan.  Time of death 2:10 pm on 10/25/2018.  Final diagnoses: Acute anoxic brain injury with generalized brain edema with poor gray-white differentiation, cytotoxic edema more dense, central downward brain descent Acute hypoxic and hypercapnic respiratory failure Aspiration pneumonia Accidental cocaine, amphetamine, and marijuana overdose Cardiogenic shock from asystolic cardiac arrest Acute kidney injury from ATN Anion gap metabolic acidosis with lactic acidosis  Coralyn Helling, MD [facility] Pulmonary/Critical Care 10/11/2018, 12:26 AM

## 2018-10-12 ENCOUNTER — Encounter (HOSPITAL_COMMUNITY): Payer: Self-pay | Admitting: Emergency Medicine

## 2018-10-12 LAB — CULTURE, RESPIRATORY W GRAM STAIN

## 2018-10-12 LAB — CULTURE, BLOOD (ROUTINE X 2)
Culture: NO GROWTH
Culture: NO GROWTH
Special Requests: ADEQUATE

## 2018-10-12 LAB — CULTURE, BAL-QUANTITATIVE W GRAM STAIN
Culture: 10000 — AB
Special Requests: NORMAL

## 2018-10-14 LAB — CULTURE, BLOOD (ROUTINE X 2)
Culture: NO GROWTH
Culture: NO GROWTH
Special Requests: ADEQUATE
Special Requests: ADEQUATE

## 2018-10-22 NOTE — Progress Notes (Addendum)
Spoke w/ pts step-sister Colin Mulders.  Colin Mulders able to provide password and updates provided.  Did advise Brianna to contact pts mom Crystal who should be able to provide any updates from now on.

## 2018-10-22 NOTE — Progress Notes (Signed)
Pt transported to and from CT without event. RT will continue to monitor.

## 2018-10-22 NOTE — Progress Notes (Signed)
Pts BMP back, spoke w/ Nephrology to relay multiple abnormal results and relayed that pt going for brain death scan today.

## 2018-10-22 NOTE — Progress Notes (Signed)
Received call from lab. Pts 2nd lactic acid: 3.6.  Relayed to CCM MD

## 2018-10-22 NOTE — Progress Notes (Signed)
RT NOTES: ABG obtained on settings of PCV 30/30/30%/+8. Results for Bobby Weaver, Bobby Weaver (MRN [id number]) as of 09/26/2018 17:55  Ref. Range  17:52  Sample type Unknown ARTERIAL  pH, Arterial Latest Ref Range: 7.350 - 7.450  7.666 (HH)  pCO2 arterial Latest Ref Range: 32.0 - 48.0 mmHg 32.5  pO2, Arterial Latest Ref Range: 83.0 - 108.0 mmHg 52.0 (L)  TCO2 Latest Ref Range: 22 - 32 mmol/L 38 (H)  Acid-Base Excess Latest Ref Range: 0.0 - 2.0 mmol/L 16.0 (H)  Bicarbonate Latest Ref Range: 20.0 - 28.0 mmol/L 36.8 (H)  O2 Saturation Latest Units: % 91.0  Patient temperature Unknown 102.0 F  Collection site Unknown RADIAL, ALLEN'S TEST ACCEPTABLE

## 2018-10-22 NOTE — Procedures (Signed)
Arterial Catheter Insertion Procedure Note Bobby Weaver [id number] 1995/11/19  Procedure: Insertion of Arterial Catheter  Indications: Blood pressure monitoring and Frequent blood sampling  Procedure Details Consent: Unable to obtain consent because of emergent medical necessity. Time Out: Verified patient identification, verified procedure, site/side was marked, verified correct patient position, special equipment/implants available, medications/allergies/relevent history reviewed, required imaging and test results available.  Performed  Maximum sterile technique was used including antiseptics, cap, gloves, gown, hand hygiene, mask and sheet. Skin prep: Chlorhexidine; local anesthetic administered 22 gauge catheter was inserted into left radial artery using the Seldinger technique. ULTRASOUND GUIDANCE USED: NO Evaluation Blood flow good; BP tracing good. Complications: No apparent complications.   Bobby Weaver 10/15/2018

## 2018-10-22 NOTE — Progress Notes (Addendum)
Received call from ICU RN that [employer] needed to be contacted in order for patient's brother, Jari Favre. Montez Morita, to be able to return home. Information collected from patient's mother, Bonne Dolores, and from brother, Lamona Curl. While talking to [employer], representative (Shawn) noted open case [id number] which had been opened by patient's other brother 30-45 minutes earlier. Representative reviewed for missing information, and will follow their process for contacting family member. F/u with Shawn who advised that message was being formatted for delivery.   Update: Received call from [employer] requesting MD information. Provided contacts for Attending and Palliative.   Hortencia Conradi, RN MSN CCM Transitions of Care

## 2018-10-22 NOTE — Progress Notes (Signed)
Assisted tele visit to patient with mother and family member.  Kerrick Miler M Anetha Slagel, RN   

## 2018-10-22 NOTE — Progress Notes (Signed)
Pt with desaturation to 88% on 60% fio2, no change noted in lung auscultation, oxygen breath given and RT notified. Will increase fio2 to 100% per RT approval. Oxygen sat increased to 98% on 100%fio2

## 2018-10-22 NOTE — Procedures (Signed)
Bronchoscopy Procedure Note Bobby Weaver [id number] 12/09/1995  Procedure: Bronchoscopy Indications: Diagnostic evaluation of the airways prior to organ donation after brain death determination.  Procedure Details Consent: Risks of procedure as well as the alternatives and risks of each were explained to the (patient/caregiver).  Consent for procedure obtained. Time Out: Verified patient identification, verified procedure, site/side was marked, verified correct patient position, special equipment/implants available, medications/allergies/relevent history reviewed, required imaging and test results available.  Performed  In preparation for procedure, patient was given 100% FiO2.  Bronchoscope entered through ETT and advanced to carina which was sharp.  Lt main bronchus entered.  Lt upper, lingular, lower lobes visualized.  No endobronchial lesions, mucosa normal, and minimal clear secretions that suctioned away easily w/o reaccumulation.  Rt main bronchus entered.  Rt upper, middle, lower lobes visualized.  No endobronchial lesions, mucosa normal, and minimal clear secretions that suctioned away easily w/o reaccumulation.  Wedged bronchoscope into right lower lobe subsegmental airway.  Instilled 20 ml of saline with 20 ml of clear white to yellow fluid returned.  Bronchoscope withdrawn without complications.  Evaluation Specimens:  BAL from right lower lobe for quantitative culture. Complications: No apparent complications  Chesley Mires, MD [place] Oct 17, 2018, 5:06 PM

## 2018-10-22 NOTE — Progress Notes (Signed)
Nutrition Brief Note   Pt screened for new Vent  23yo male presenting with cardiac arrest after drug overdose with anoxic brain injury.   Chart reviewed and also spoke with RN.  Pt with little chance of recovery; per MD note, hospital death expected.   No nutrition interventions warranted at this time.  Please re-consult as needed.   Betsey Holiday MS, RD, LDN Pager #- [phone] Office#- [phone] After Hours Pager: [phone]

## 2018-10-22 NOTE — Progress Notes (Addendum)
   Subjective: HD#1   Overnight: Febrile with Tmax of 103F, tachycardic to 140s, tachypneic to 30s, Labile BP  Today, [employer] remains unresponsive.  Objective:  Vital signs in last 24 hours: Vitals:   10/07/2018 0600 09/27/2018 0615 10/06/2018 0630 10/15/2018 0645  BP: 114/80 119/69 124/69 128/75  Pulse:   (!) 145 (!) 149  Resp: (!) 30 (!) 30 (!) 30 (!) 30  Temp: (!) 103.3 F (39.6 C) (!) 103.1 F (39.5 C) (!) 102.9 F (39.4 C) (!) 102.6 F (39.2 C)  TempSrc:      SpO2:   100% 100%  Weight:      Height:        Const: Unresponsive and sedated  HEENT: ETT in place, pupils dilated non-reactive to light  Resp: CTA anteriorly CV: RRR, no MGR Abd: Tense, diffused Ext: No edema Neuro: Unresponsive Skin: No edema   Assessment/Plan:  Active Problems:   Overdose   Acute respiratory failure with hypoxia and hypercapnia (HCC)   Hypoxic brain injury [facility])   Palliative care by specialist  Mr. Gladysz is a 23 year old gentleman with no significant past medical issue with exception of IV drug use who initially presented to [facility] emergency department on Oct 05, 2017 following asystole arrest with unknown downtime however suspecting >30 minutes.  Since admission, he has remained unresponsive with little progress.  1.  Cardiac arrest with unknown downtime likely secondary to IV drug use: His urine toxicology was positive for amphetamine and cocaine.  He has made little to no progress since arrival and recent CT head revealed diffuse cerebral edema with high suspicion of anoxic brain injury and subarachnoid hemorrhage.   Plan: - Neurology currently following - EEG reviewed burst suppression - Appreciate assistance from palliative care.  2.  Acute hypercarbic and hypoxic respiratory failure secondary to #1: Continues to require full mechanical ventilation support.  Current vent setting PCV/FiO2 100.  Initial CT cervical spine without contrast had shown possible evidence of  pulmonary edema however repeat chest x-ray this a.m. shows improvement of airspace disease.  There is a new chest wall emphysema suspecting of probable pneumomediastinum however no evidence of pneumothorax.  Vital signs shows persistent febrile episodes with T-max 103.5, tachycardia to 137 and tachypneic to 30s.  Plan: - GOC discussion   3.  Acute kidney injury with mixed anion gap metabolic acidosis and respiratory acidosis 2/2 ATN & cardiac arrest: Rhabdomyolysis less likely as CK was 574.  Anion gap metabolic acidosis has resolved.  ABG shows improved respiratory acidosis 7.29/70 4/350  Plan: - GOC discussion   4.  Hypocalcemia: Status post calcium gluconate  5.  Polysubstance use: Continue folic acid, thiamine  FEN: Replace electrolytes as needed VTE ppx: Subcutaneous heparin CODE STATUS: Full code  Yvette Rack, MD 09/28/2018, 7:45 AM Pager: [phone] IMTS PGY-1

## 2018-10-22 NOTE — Progress Notes (Signed)
eLink Physician-Brief Progress Note Patient Name: Riyan Faron DOB: 1996/03/12 MRN: [id number]   Date of Service  09/26/2018  HPI/Events of Note  Request to repeat COVID-19 rapid test at [facility] .   eICU Interventions  Will order COVID-19 rapid test at [facility].      Intervention Category Major Interventions: Infection - evaluation and management  Lenell Antu 10/19/2018, 7:31 PM

## 2018-10-22 NOTE — Progress Notes (Signed)
Called elink, informed Dr Deterding pt due for f/u head ct, current peak pressure 40s, high minute ventilation, HR 140s and core temp 103.5 on cooling blanket. Dr Deterding states okay to transport pt down for [facility]

## 2018-10-22 NOTE — Progress Notes (Addendum)
Per MD, ok to let family come see pt at this time. Spoke w/ pts mom to relay. Per pts mom, pts brother in [employer] and MD needs to speak w/ [employer] in order for brother to be released to see pt. Relayed to MD. Attempting to reach [employer] and [facility] to coordinate this. Spoke w/ hospital front desk to provide names of pts family members for hospital access.   1015: Spoke w/ Toniann Fail, RN CM who states she is able to coordinate phone call btwn MD and Casimer Bilis for pts brother.  Provided Toniann Fail w/ pts mom's number, CCM MD number, and Palliative MD number.  Dr. Craige Cotta, CCM MD and Dr. Linna Darner, Palliative MD both updated.    Spoke w/ Hurshel Party, [facility] to provide updates on pt.  Per Jorja Loa, CDS will plan to meet w/ family at hospital  Neuro at bedside this AM to eval and assess pt. Spoke w/ Nuclear Med and RT; pt to go for brain death eval ~12/as soon as nuclear med is able to receive.

## 2018-10-22 NOTE — Significant Event (Signed)
PCCM UPDATE   PRVC TV 600 RR 30 PEEP 15 FiO2 100 ABG: 7.181/95/356/33 CT CERVICAL SPINE Upper chest: Septal thickening and patchy ground-glass opacities suggesting pulmonary edema CXR5/17 at 1030AM  showed Developing bilateral pulmonary infiltrates   Interventions: PC  P high 30 PEEP 15 Rate 30 TV 600-700cc on this setting Wave form is synchronous and pt is not breath stacking Will get ABG at 1:30 AM Pt on low dose Levophed ggt  Creatinine 2.64 will hold on IV diuretic at this time and reassess.  Marland Kitchen.Signed Dr Newell Coral Pulmonary Critical Care Locums

## 2018-10-22 NOTE — Progress Notes (Signed)
..   NAME:  Bobby Weaver, MRN:  [id number], DOB:  [birth year], LOS: 1 ADMISSION DATE:  10/05/2018, CONSULTATION DATE:  10/13/2018 REFERRING  MD:  Elliot Gurney MD, CHIEF COMPLAINT:  Post cardiac arrest   Brief History   23 yo male found unresponsive at home in cardiac arrest with asystole.  Had ROSC after more than 30 minutes.  UDS positive for cocaine, amphetamines, THC.  No significant past medical history.  Significant Hospital Events   5/16 Admit 5/17 Febrile, worsening hypoxia 5/18 clinical exam consistent with brain death   Consults:  Nephrology Neurology  Procedures:  ETT 5/16 >>  Lt PICC 5/17 >>   Significant Diagnostic Tests:  CT head 5/16 >> diffuse sulcal effacement and loss of CSF space, mild effacement of foramen magnum, loss of gray-white differentiation in BG CT head 5/18 >> generalized brain edema with poor gray-white differentiation, cytotoxic edema more dense, central downward brain descent  Micro Data:  COVID 5/16 >> negative Blood 5/17 >>  Antimicrobials:  unasyn 5/17 >>  Interim history/subjective:  Remains on vent, pressors, cooling blanket.  Objective   Blood pressure (!) 114/100, pulse (!) 138, temperature (!) 100.6 F (38.1 C), resp. rate (!) 30, height 5\' 8"  (1.727 m), weight 67.9 kg, SpO2 100 %.    Vent Mode: PCV FiO2 (%):  [60 %-100 %] 100 % Set Rate:  [30 bmp] 30 bmp Vt Set:  [600 mL] 600 mL PEEP:  [10 cmH20-15 cmH20] 10 cmH20 Plateau Pressure:  [29 cmH20-35 cmH20] 32 cmH20   Intake/Output Summary (Last 24 hours) at 10/11/2018 0859 Last data filed at 09/23/2018 0800 Gross per 24 hour  Intake 3656.9 ml  Output 2055 ml  Net 1601.9 ml   Filed Weights   10/19/2018 2214 10/07/18 0500  Weight: 68 kg 67.9 kg    Examination:  General - uresponsive Eyes - pupils fixed, dilated ENT - ETT in place Cardiac - regular, tachycardic Chest - b/l crackles Abdomen - soft, absent bowel sounds Extremities - 1+ edema Skin - no rashes Neuro - no brain  stem reflexes, no response to stimulation   Resolved issues    Assessment & Plan:   Acute hypoxic, hypercapnic respiratory failure. Plan - assess for brain death  Acute anoxic encephalopathy with cytotoxic cerebral edema. Accidental overdose with cocaine, amphetamines, THC. Discussion: Clinical exam consistent with brain death. Plan - will need to d/w family  Septic shock from aspiration pneumonia. Plan - continue ABx  Cardiogenic shock after asystolic cardiac arrest. Plan - pressors to keep MAP > 65  Acute Kidney Injury from ATN. Anion gap acidosis with lactic acidosis. Plan - continue HCO3 for now  Best practice:  Diet: NPO DVT prophylaxis: Heparin [place] with SCDs GI prophylaxis: pepcid Mobility: Bedrest Code Status: Full    Labs:   CMP Latest Ref Rng & Units 10/07/2018 10/07/2018 10/07/2018  Glucose 70 - 99 mg/dL 810(F) 98 751(W)  BUN 6 - 20 mg/dL 25(E) 52(D) 20  Creatinine 0.61 - 1.24 mg/dL 7.82(U) 2.35(T) 6.14(E)  Sodium 135 - 145 mmol/L 146(H) 150(H) 151(H)  Potassium 3.5 - 5.1 mmol/L 4.5 3.8 4.4  Chloride 98 - 111 mmol/L 100 105 108  CO2 22 - 32 mmol/L 31 26 21(L)  Calcium 8.9 - 10.3 mg/dL 3.1(VQ) 7.4(L) 7.6(L)  Total Protein 6.5 - 8.1 g/dL - - -  Total Bilirubin 0.3 - 1.2 mg/dL - - -  Alkaline Phos 38 - 126 U/L - - -  AST 15 - 41 U/L - - -  ALT 0 - 44 U/L - - -   CBC Latest Ref Rng & Units 10/07/2018 10/07/2018 10/07/2018  WBC 4.0 - 10.5 K/uL 6.5 - -  Hemoglobin 13.0 - 17.0 g/dL 19.3(H) 20.4(H) 20.1(H)  Hematocrit 39.0 - 52.0 % 59.4(H) 60.0(H) 59.0(H)  Platelets 150 - 400 K/uL 273 - -   ABG    Component Value Date/Time   PHART 7.299 (L) 09/21/2018 0356   PCO2ART 74.2 (HH) 09/27/2018 0356   PO2ART 350 (H) 09/30/2018 0356   HCO3 33.9 (H) 10/02/2018 0356   TCO2 16 (L) 10/07/2018 0126   ACIDBASEDEF 10.7 (H) 10/07/2018 0356   O2SAT 99.3 09/26/2018 0356   CBG (last 3)  Recent Labs    10/07/18 1655 10/07/18 2322 09/27/2018 0721  GLUCAP 89 79 132*    D/w Dr. Elon SpannerAroora  CC time 32 minutes  Coralyn HellingVineet Anamari Galeas, MD [facility] Pulmonary/Critical Care 10/19/2018, 9:15 AM

## 2018-10-22 NOTE — Progress Notes (Signed)
[facility] at bedside, working w/ pts family on paperwork.  [facility] now dictating pts care.  Labs, meds, bronch, EKG, and echo ordered per protocol.

## 2018-10-22 NOTE — Progress Notes (Signed)
eLink Physician-Brief Progress Note Patient Name: Bobby Weaver DOB: 1995-10-12 MRN: [id number]   Date of Service  10/03/2018  HPI/Events of Note  Hypokalemia, Hypocalcemia, Hypomagnesemia and Hypophosphatemia - K+ = 2.7, Mg++ = 1.3, PO4--- = 1.7, Ca++ = 5.2 and Creatinine = 2.39.  eICU Interventions  Will replace K+, Ca++, Mg++ and PO4---.     Intervention Category Major Interventions: Electrolyte abnormality - evaluation and management  Lamar Meter Eugene 09/27/2018, 8:12 PM

## 2018-10-22 NOTE — Progress Notes (Signed)
Spoke with pt's mother over the phone.  Discussed clinical findings and concern that these are consistent with brain death.  She has requested confirmation study be performed.  Will proceed with cerebral perfusion scan to confirm brain death.  Given the situation, I have also requested that the staff allow limited number of family visitors.  Coralyn Helling, MD [facility] Pulmonary/Critical Care 09/21/2018, 9:30 AM

## 2018-10-22 NOTE — Progress Notes (Signed)
Recruitment maneuver done per CDS requirement. ABG in 30 min. RT will continue to monitor

## 2018-10-22 NOTE — Progress Notes (Signed)
Spoke w/ pts mom to provide updates. Also spoke w/ E-link to set up virtual visit for pts mom.

## 2018-10-22 NOTE — Progress Notes (Addendum)
Subjective: Patient unresponsive. This morning developed fixed and dilated pupils.  Objective: Current vital signs: BP (!) 114/100   Pulse (!) 138   Temp (!) 100.6 F (38.1 C)   Resp (!) 30   Ht 5' 8"  (1.727 m)   Wt 67.9 kg   SpO2 100%   BMI 22.76 kg/m  Vital signs in last 24 hours: Temp:  [98.4 F (36.9 C)-103.5 F (39.7 C)] 100.6 F (38.1 C) (05/18 0800) Pulse Rate:  [102-151] 138 (05/18 0800) Resp:  [22-31] 30 (05/18 0800) BP: (52-140)/(24-105) 114/100 (05/18 0800) SpO2:  [78 %-100 %] 100 % (05/18 0800) FiO2 (%):  [60 %-100 %] 100 % (05/18 0759)  Intake/Output from previous day: 05/17 0701 - 05/18 0700 In: 3656.9 [I.V.:3347.4; IV Piggyback:309.5] Out: 1830 [Urine:730; Emesis/NG output:1100] Intake/Output this shift: Total I/O In: -  Out: 225 [Urine:225] Nutritional status:  Diet Order            Diet NPO time specified  Diet effective now              Neurologic Exam: Mental status: unresponsive CN: pupils fixed and dilated to 91m bilaterally, no corneal reflex; no facial assymetry Ext: no spontaneous movements or movement elicited by pain Sensory: No response to pain throughout Resp:  mechanically ventilated, no breath triggering  Lab Results: Bmet 10/07/18: Na 146, K 4.5, CO2 31, BUN 31, Cr 2.64, Ca 6.0 Mag 5/17: 2.4 Phos 5/17: 9.0 CBC 5/17: WBC 6.5, Hgb 19.3, Hct 59.4, plts 273 CK 5/17: 574 Troponin peaked at 1.04 on 5/17 Cortisol 5/17 at 2AM: 45.3 Liver panel 5/17: AST 213, ALT 156, alk phos 119, Bili 0.6 HIV 5/17: nonreactive UDS 5/16: Positive for cocaine, amphetamines, THC COVID-19 screen 5/16: negative Ethanol, salicylate, and acetaminophen level undetectable on admission  Studies/Results: CT head 09/21/2018: Diffuse brain edema with loss of CSF space and mild effacement of foramen magnum with loss of gray-white differentiation in basal ganglia. Pseudo-subarachnoid hemorrhage related to edema.  CT head 52020-05-28 Advanced hypoxic ischemic  injury with worsening brain edema and elevated intracranial pressure. 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  Medications:  Scheduled: . Chlorhexidine Gluconate Cloth  6 each Topical Q0600  . famotidine  20 mg Oral BID  . folic acid  1 mg Intravenous Daily  . gabapentin  100 mg Per Tube Q8H  . glycopyrrolate  0.2 mg Intravenous QID  . heparin  5,000 Units Subcutaneous Q8H  . sodium chloride flush  10-40 mL Intracatheter Q12H  . thiamine injection  100 mg Intravenous Daily    Assessment/Plan:  [age]yoale presenting with cardiac arrest after drug overdose with extended down time estimated at about 4034m. Patient with progressive diffuse brain edema, loss of gray-white differentiation in basal ganglia, and 6mm65mnsilar herniation noted on CT scan today; on exam he has loss of brain stem reflexes which is clinically consistent with brain death. Due to prolonged down time he does have AKI and shock liver so the metabolic derangements can confound brain death exam and an ancillary study might be of use.  Attending addendum to follow.     LOS: 1 day   GoriAlphonzo Grieve PGY3 09/1803-28-202018 AM  Attending addendum Patient seen and examined. Neurological exam: Intubated, no sedation.  Pupils 89 mm fixed, dilated.  No corneal reflex.  No facial asymmetry.  No spontaneous movements.  No movement to noxious stimulation.  Not breathing over the ventilator.  No cough.  No gag. I have personally reviewed  the CT of the head done today and on 10/04/2018. CT from 10/04/2018 had showed diffuse brain edema with loss of CSF spaces and mild effacement of the foramen magnum and loss of gray-white differentiation in the basal ganglia with pseudo-subarachnoid pattern related to edema and the scan done today shows advanced hypoxic injury with worsening brain edema, elevated intracranial pressure and cerebellar tonsillar herniation to 6 mm below the foramen magnum along with the exacerbated  pseudo-subarachnoid pattern.  Assessment: 23 year old presenting after cardiac arrest secondary to possible drug overdose with a downtime of 40 minutes.  Brain imaging with diffuse severe cerebral edema and cerebellar tonsillar herniation, long downtime with cardiac arrest per history and clinical exam today is consistent with brain death.   Due to the patient's young age and concurrent lab derangements, I would not be aversive to performing a confirmatory study for brain death.  NM cerebral flow study can be performed.  Recommendations Clinical exam pointing towards the fact that he has progressed to brain death. Confirmatory study pending at this time. Discussed findings with Dr. Halford Chessman, who also spoken to the family and will be ordering the cerebral flow study.  Please call neurology with questions.  -- Amie Portland, MD [facility] Pager: [phone] If 7pm to 7am, please call on call as listed on AMION.   CRITICAL CARE ATTESTATION Performed by: Amie Portland, MD Total critical care time: 35 minutes Critical care time was exclusive of separately billable procedures and treating other patients and/or supervising APPs/Residents/Students Critical care was necessary to treat or prevent imminent or life-threatening deterioration due to severe anoxic brain injury This patient is critically ill and at significant risk for neurological worsening and/or death and care requires constant monitoring. Critical care was time spent personally by me on the following activities: development of treatment plan with patient and/or surrogate as well as nursing, discussions with consultants, evaluation of patient's response to treatment, examination of patient, obtaining history from patient or surrogate, ordering and performing treatments and interventions, ordering and review of laboratory studies, ordering and review of radiographic studies, pulse oximetry, re-evaluation of patient's condition,  participation in multidisciplinary rounds and medical decision making of high complexity in the care of this patient.

## 2018-10-22 NOTE — Death Summary Note (Signed)
23 yo male found unresponsive at home in cardiac arrest with asystole.  Had ROSC after more than 30 minutes.  UDS positive for cocaine, amphetamines, THC.  No significant past medical history.  Treated for aspiration pneumonia.  CT head showed changes of anoxic brain injury.  Neurology and nephrology consulted.  COVID negative.  Clinical exam consistent with brain death on 10-30-2018.  Confirmed with cerebral perfusion scan.  Time of death 2:10 pm on October 30, 2018.  Final diagnoses: Acute anoxic brain injury with generalized brain edema with poor gray-white differentiation, cytotoxic edema more dense, central downward brain descent Acute hypoxic and hypercapnic respiratory failure Aspiration pneumonia Accidental cocaine, amphetamine, and marijuana overdose Cardiogenic shock from asystolic cardiac arrest Acute kidney injury from ATN Anion gap metabolic acidosis with lactic acidosis  Coralyn Helling, MD [facility] Pulmonary/Critical Care 10/11/2018, 12:27 AM

## 2018-10-22 NOTE — Progress Notes (Signed)
   09/27/2018 1421  Clinical Encounter Type  Visited With Patient and family together;Health care provider  Visit Type Initial;Spiritual support  Referral From Palliative care team;Nurse  Consult/Referral To Chaplain  Spiritual Encounters  Spiritual Needs Prayer;Emotional;Grief support  This chaplain responded to PMT request for Pt. and family spiritual care. The chaplain was pastorally present through listening and prayer and the Pt. mother. The chaplain heard the Pt. father request to be alone with his family.  The chaplain stepped away and will F/U with spiritual care as needed.

## 2018-10-22 NOTE — Progress Notes (Signed)
This chaplain followed up with spiritual care for the family. The chaplain is available for F/U spiritual care as needed.

## 2018-10-22 NOTE — Progress Notes (Signed)
Patient had cerebral perfusion blood scan.  Received call from radiologist informing results are consistent with absence of cerebral blood flow consistent with brain death.  Time of death 2:10 pm.    Informed patients parents at bedside.  Coralyn Helling, MD [facility] Pulmonary/Critical Care 11/05/2018, 2:50 PM

## 2018-10-22 NOTE — Progress Notes (Addendum)
Daily Progress Note   Patient Name: Bobby Weaver       Date: 10/20/2018 DOB: 01/10/1996  Age: 23 y.o. MRN#: [id number] Attending Physician: Chesley Mires, MD Primary Care Physician: Patient, No Pcp Per Admit Date: 10/19/2018  Reason for Consultation/Follow-up: Terminal Care  Subjective: Patient is completely unresponsive, has fixed and dilated pupils.    Length of Stay: 1  Current Medications: Scheduled Meds:  . Chlorhexidine Gluconate Cloth  6 each Topical Q0600  . famotidine  20 mg Per Tube BID  . glycopyrrolate  0.2 mg Intravenous QID  . heparin  5,000 Units Subcutaneous Q8H  . sodium chloride flush  10-40 mL Intracatheter Q12H    Continuous Infusions: . sodium chloride    . sodium chloride    . ampicillin-sulbactam (UNASYN) IV Stopped (10/15/2018 6967)  . norepinephrine (LEVOPHED) Adult infusion 6 mcg/min (09/29/2018 1025)  .  sodium bicarbonate  infusion 1000 mL 125 mL/hr at Oct 09, 2018 0500    PRN Meds: Place/Maintain arterial line **AND** sodium chloride, acetaminophen, ondansetron (ZOFRAN) IV, sodium chloride flush  Physical Exam         Unresponsive Fixed pupils Warm to touch Mechanically ventilated S 1 S 2 No edema  Vital Signs: BP (!) 114/100   Pulse (!) 138   Temp (!) 100.6 F (38.1 C)   Resp (!) 30   Ht _0  (1.727 m)   Wt 67.9 kg   SpO2 100%   BMI 22.76 kg/m  SpO2: SpO2: 100 % O2 Device: O2 Device: Ventilator O2 Flow Rate:    Intake/output summary:   Intake/Output Summary (Last 24 hours) at 10/03/2018 1114 Last data filed at Oct 09, 2018 1100 Gross per 24 hour  Intake 3656.9 ml  Output 2180 ml  Net 1476.9 ml   LBM:   Baseline Weight: Weight: 68 kg Most recent weight: Weight: 67.9 kg       Palliative Assessment/Data:    Flowsheet Rows    Most Recent Value  Intake Tab  Referral Department  Critical care  Unit at Time of Referral  ICU  Palliative Care Primary Diagnosis  Neurology  Date Notified  10/07/18  Palliative Care Type  New Palliative care  Reason for referral  Clarify Goals of Care  Date of Admission  09/28/2018  Date first seen by Palliative Care  10/07/18  # of  days Palliative referral response time  0 Day(s)  # of days IP prior to Palliative referral  1  Clinical Assessment  Palliative Performance Scale Score  10%  Pain Max last 24 hours  3  Pain Min Last 24 hours  2  Dyspnea Max Last 24 Hours  3  Dyspnea Min Last 24 hours  2  Nausea Max Last 24 Hours  0  Nausea Min Last 24 Hours  0  Anxiety Max Last 24 Hours  2  Anxiety Min Last 24 Hours  1  Psychosocial & Spiritual Assessment  Palliative Care Outcomes  Patient/Family meeting held?  No      Patient Active Problem List   Diagnosis Date Noted  . Overdose 10/07/2018  . Cardiac arrest ([place])   . Endotracheally intubated   . Metabolic acidosis   . Acute respiratory failure with hypoxia and hypercapnia ([facility])   . 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 Palliative care by specialist     Palliative Care Assessment & Plan   Patient Profile:   Assessment: 23yo male presenting with cardiac arrest after drug overdose with extended down time estimated at about 91mns. Patient with progressive diffuse brain edema, loss of gray-white differentiation in basal ganglia, and [id number] herniation noted on CT scan today; it is noted that as per neuro,on exam he has loss of brain stem reflexes which is clinically consistent with brain death. Additionally, also has septic shock from aspiration PNA,AKI and shock liver.   A cerebral perfusion study has been requested to further investigate brain death.   Recommendations/Plan:  patient seen and examined, discussed with bedside RN. PMT remains available to facilitate correspondence with [facility] and to be an additional source  of support for the family.  Call placed and discussed with mother. She is trying to come to the hospital as soon as she can. Discussed with her about the patient's pending cerebral perfusion study, current condition. She is very tearful and distraught. I did discuss with her about not doing CPR if the patient's heart stops while he is on the vent. I believe she may have understood the conversation, how ever, she was very tearful, stating, "keep my baby going until I can come see him, don't take him off." I reassured her that the patient was still on the ventilator but that from a neurological standpoint, he has no responses. She is aware that he remains critically ill with high likelihood of impending demise. PMT to continue to assist.    Code Status:    Code Status Orders  (From admission, onward)         Start     Ordered   10/07/18 0214  Full code  Continuous     10/07/18 0217        Code Status History    This patient has a current code status but no historical code status.       Prognosis:   Hours - Days  Discharge Planning:  Anticipated Hospital Death  Care plan was discussed with inter discilipinary team, including bedside RN and palliative staff.  Also discussed with patient's mother on the phone.   Thank you for allowing the Palliative Medicine Team to assist in the care of this patient.   Time In: 10 Time Out:  10.35 Total Time  35 Prolonged Time Billed  no       Greater than 50%  of this time was spent counseling and coordinating care related to the above assessment  and plan.  Loistine Chance, MD [phone] Please contact Palliative Medicine Team phone at [phone] for questions and concerns.   Addendum: Additional note Time in 1330 Time out 1420  Family meeting: I met with the patient's mother at the bedside. I introduced myself and palliative care as follows: Palliative medicine is specialized medical care for people living with serious illness. It focuses  on providing relief from the symptoms and stress of a serious illness. The goal is to improve quality of life for both the patient and the family.  Patient's mother is very tearful. She wished to know the details of his admission, and what was found on his urine drug screen. I was able to share with her that the urine drug screen was positive for amphetamines, cocaine and THC. She states that the patient had struggled with substance use, but was trying to get better, he visited with her on Mother's Day, just a few days ago, and told her that he was staying clean and that she ought to be proud of him. His girlfriend is pregnant. She says, "that's my baby."  Patient's parents were both present in the room, when additional information and support was provided by PCCM MD Dr Halford Chessman. It is now known that the results of the cerebral perfusion study confirm brain death, no blood flow.   Patient's family wished to spend some time with him alone in the hospital room.  [facility] have also arrived in the ICU, awaiting further discussions with the family.   We offered active listening and supportive care and presence to the patient's family. I was also able to meet extended family in the waiting room.   All of their questions addressed to the best of my ability.  Loistine Chance MD

## 2018-10-22 DEATH — deceased

## 2020-07-28 IMAGING — CT CT HEAD WITHOUT CONTRAST
5 of 8 series · 16 of 47 positions shown, 17 images · non-contrast
Comparison: None.

CLINICAL DATA: Head trauma, minor, GCS>=13, high clinical risk,
initial exam; C-spine trauma, high clinical risk (NEXUS/CCR). Found
unresponsive in the shower, heroin use.

EXAM:
CT HEAD WITHOUT CONTRAST
CT CERVICAL SPINE WITHOUT CONTRAST
TECHNIQUE: Multidetector CT imaging of the head and cervical spine was
performed following the standard protocol without intravenous
contrast. Multiplanar CT image reconstructions of the cervical spine
were also generated.

[Series 4: head without · axial · non-contrast · 0.45mm/px · z∈[-146,+14]mm · 3 of 33 slices shown, 4 images]
[im 1/33  brain]
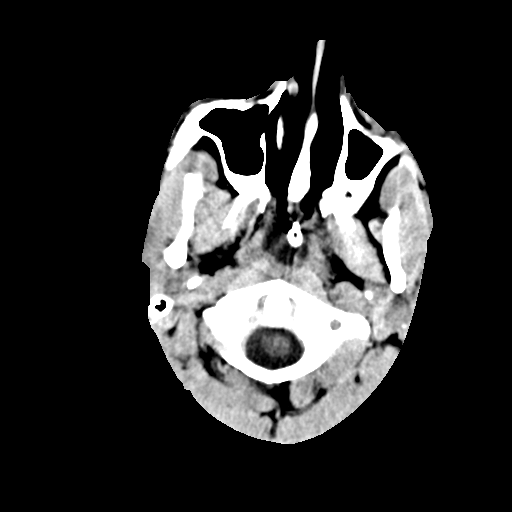
[im 1/33  bone]
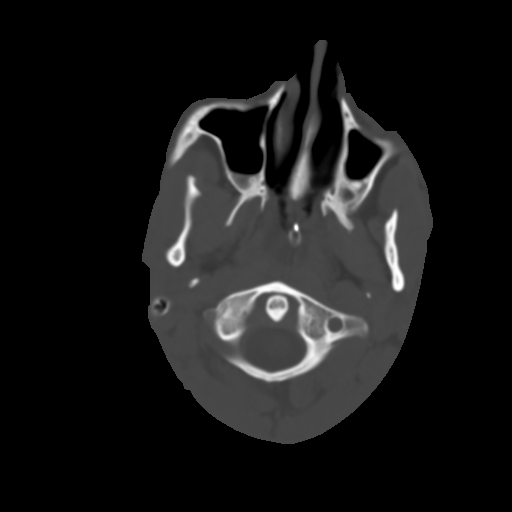
[im 17/33  brain]
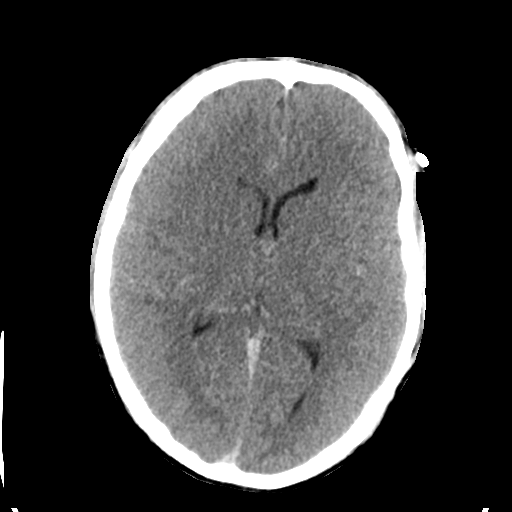
[im 33/33  brain]
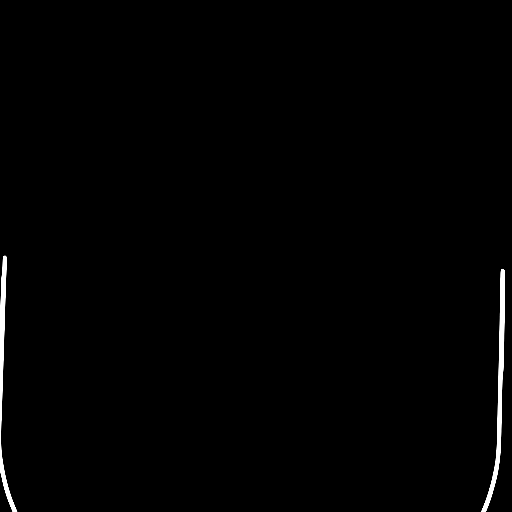

[Series 5: head bone · axial · 0.45mm/px · z∈[-124,-8]mm · 6 of 82 slices shown]
[im 12/82  bone]
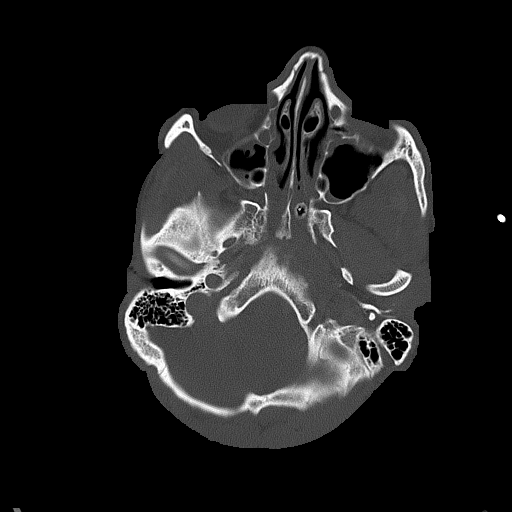
[im 24/82  bone]
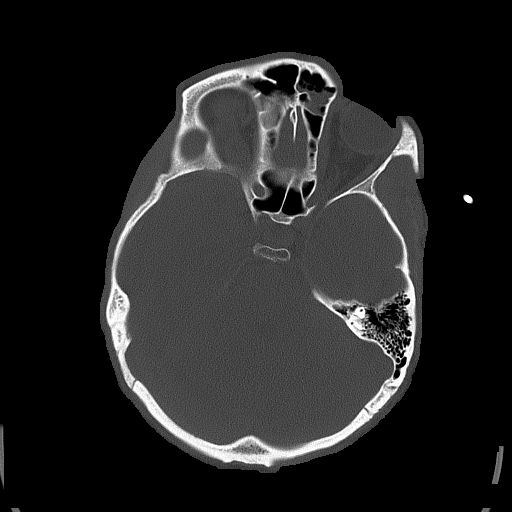
[im 35/82  bone]
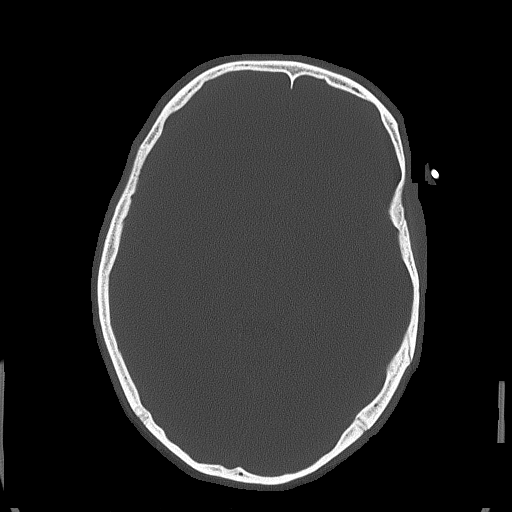
[im 47/82  bone]
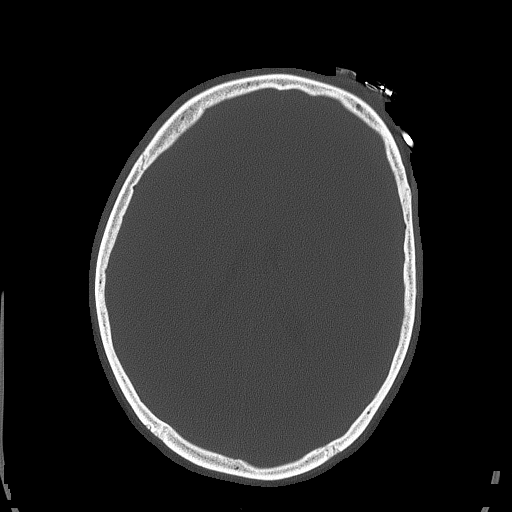
[im 58/82  bone]
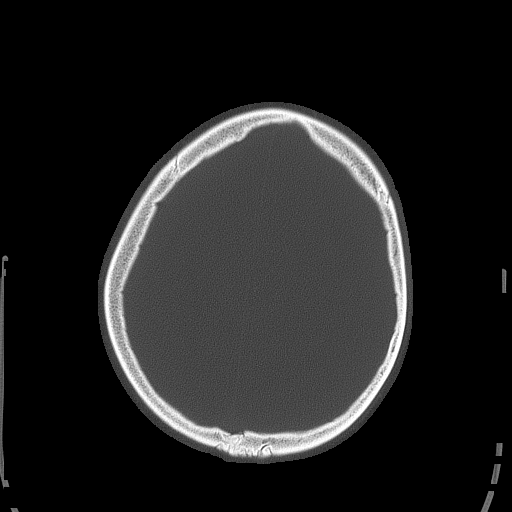
[im 70/82  bone]
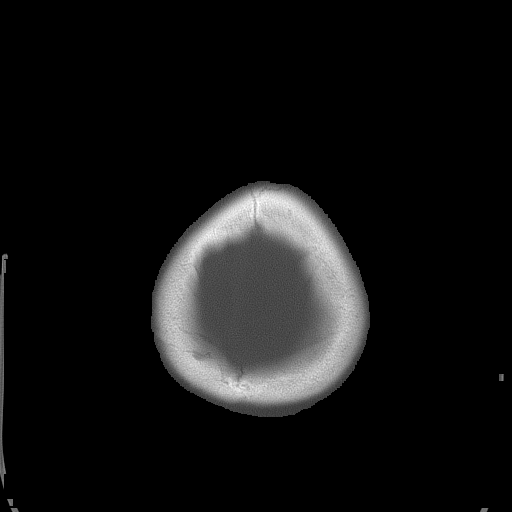

[Series 6: head without cor · coronal · non-contrast · 0.40mm/px · 3 of 73 slices shown]
[im 19/73  brain]
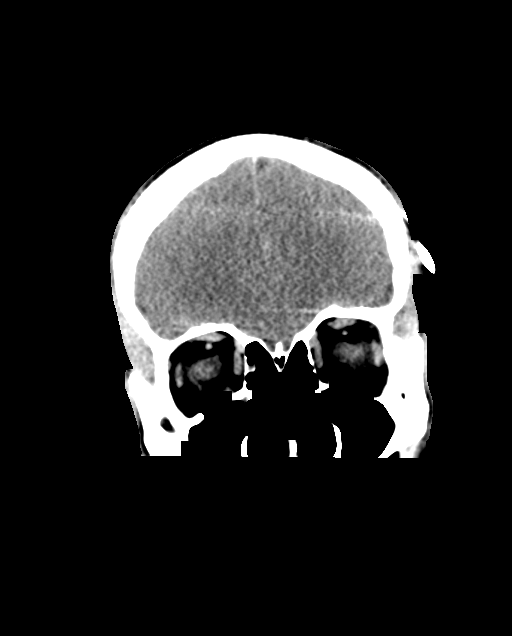
[im 37/73  brain]
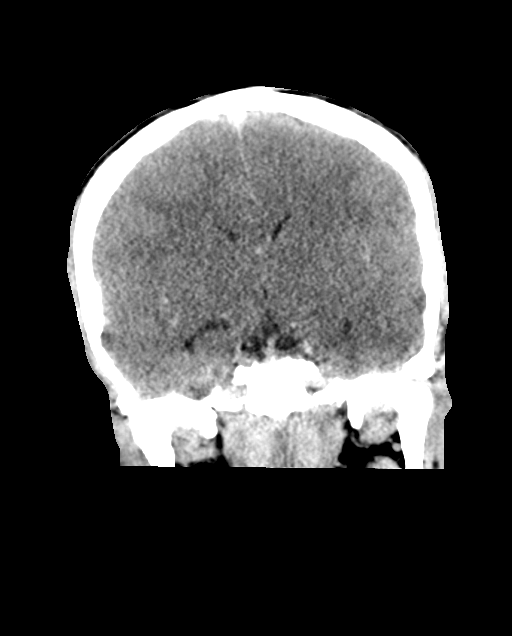
[im 55/73  brain]
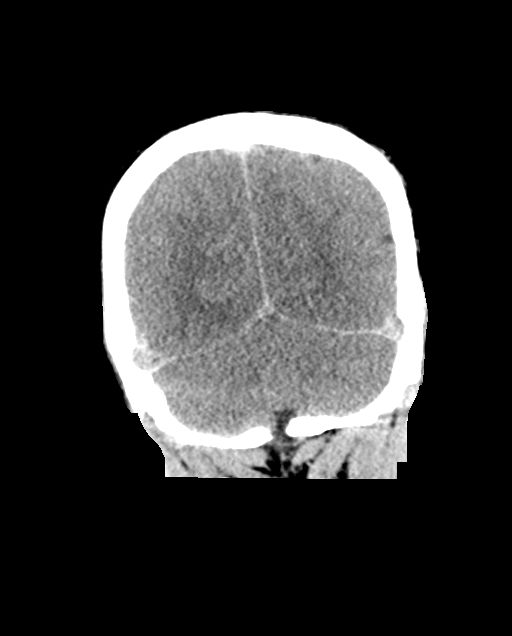

[Series 7: head without sag · sagittal · non-contrast · 0.42mm/px · 2 of 66 slices shown]
[im 22/66  brain]
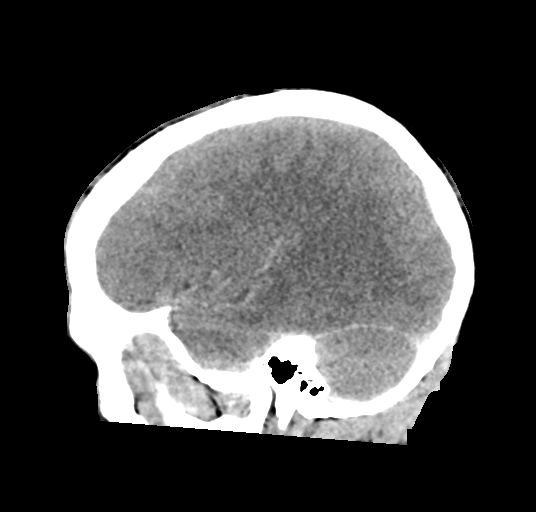
[im 44/66  brain]
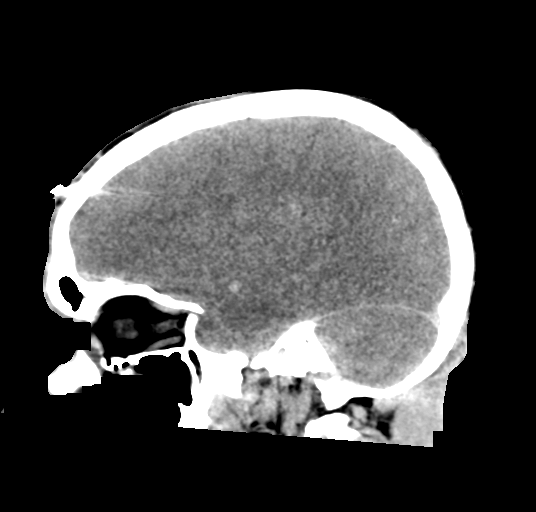

[Series 8: c_spine 2.0 st · axial · 0.46mm/px · z∈[-286,-264]mm · 2 of 104 slices shown]
[im 12/104  brain]
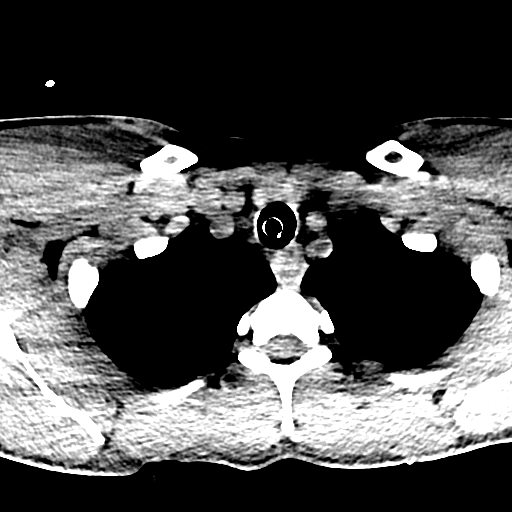
[im 23/104  brain]
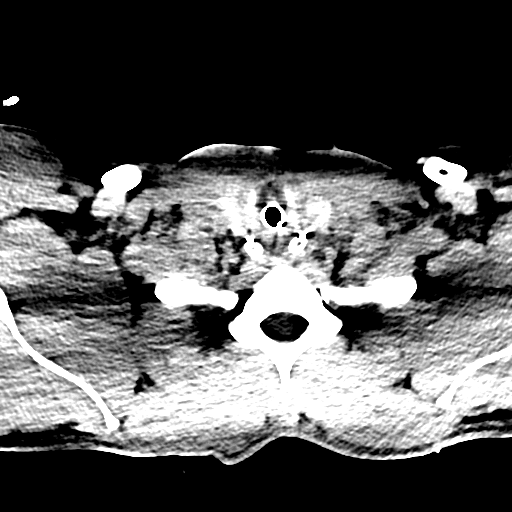

[16 of 47 positions shown; findings below may reference images not displayed]

FINDINGS: CT HEAD FINDINGS

Brain: Diffuse sulcal effacement and loss of CSF space. Lateral
ventricles are slightly small. There is mild effacement of foramen
magnum. Apparent increased density in subarachnoid spaces likely
represents pseudo subarachnoid hemorrhage related to brain edema.
Loss of gray-white differentiation in the basal ganglia. Gray-white
differentiation of the cerebral hemispheres is preserved. No
subdural or extra-axial collection.

Vascular: Increased density of the MCAs and ACAs, likely secondary
to cerebral edema.

Skull: No fracture or focal lesion.

Sinuses/Orbits: Mucosal thickening throughout the paranasal sinuses
with fluid in the sphenoid sinus and maxillary sinuses, possibly
related to intubation. Mastoid air cells are clear. Visualized
orbits are unremarkable.

Other: None.

CT CERVICAL SPINE FINDINGS

Alignment: Normal.

Skull base and vertebrae: No acute fracture. Vertebral body heights
are maintained. The dens and skull base are intact.

Soft tissues and spinal canal: Fluid in the pharynx. Enteric tube is
looped in the pharynx. No evidence of canal hematoma.

Disc levels:  Preserved.

Upper chest: Septal thickening and patchy ground-glass opacities
suggesting pulmonary edema.

Other: None.
IMPRESSION: 1. Diffuse brain edema with loss of CSF space and mild effacement of
the foramen magnum. Loss of gray-white differentiation in the basal
ganglia. Pseudo subarachnoid hemorrhage related to edema.
2. No fracture or subluxation of the cervical spine.
3. Enteric tube coiled in the pharynx, needs repositioning.
4. Incidental note of septal thickening and patchy ground-glass
opacities in the lung apices suggesting pulmonary edema.

These results were called by telephone at the time of interpretation
on 10/07/2018 at [DATE] to Dr. MARIE MARIE SOLLO , who verbally
acknowledged these results.

## 2020-07-31 IMAGING — CT CT ABDOMEN AND PELVIS WITHOUT CONTRAST
2 of 5 series · 14 of 46 positions shown, 16 images · non-contrast
Comparison: None.

CLINICAL DATA: Potential heart donor evaluation

EXAM:
CT CHEST, ABDOMEN AND PELVIS WITHOUT CONTRAST
TECHNIQUE: Multidetector CT imaging of the chest, abdomen and pelvis was
performed following the standard protocol without IV contrast.

[Series 5: cap w/o 2.0 mm st · axial · non-contrast · 0.88mm/px · z∈[+809,+1381]mm · 11 of 326 slices shown, 13 images]
[im 20/326  soft-tissue]
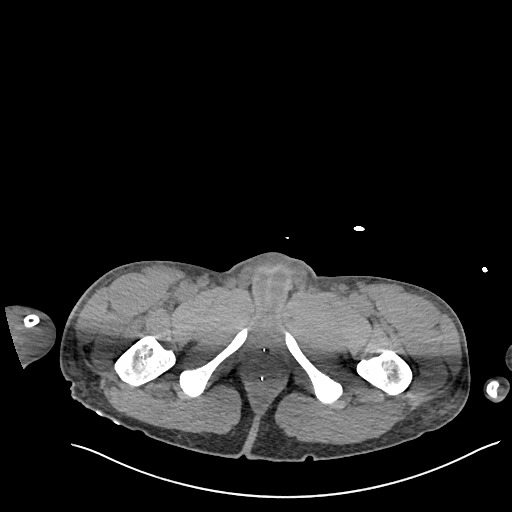
[im 20/326  bone]
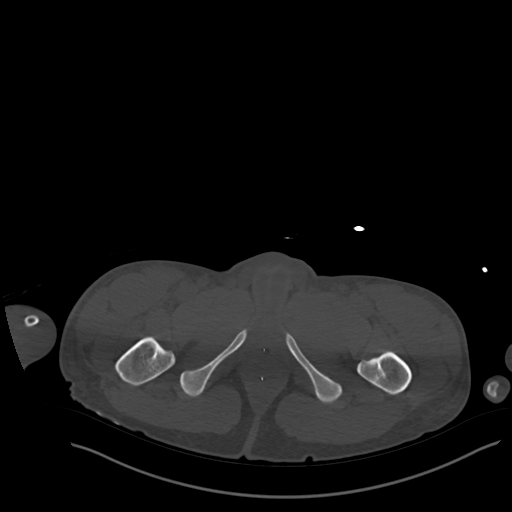
[im 58/326  soft-tissue]
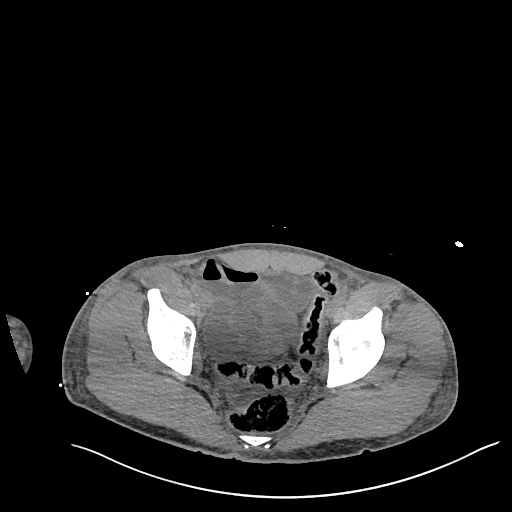
[im 77/326  soft-tissue]
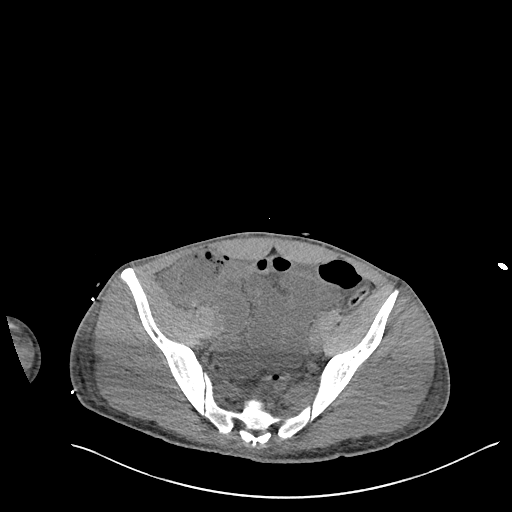
[im 115/326  soft-tissue]
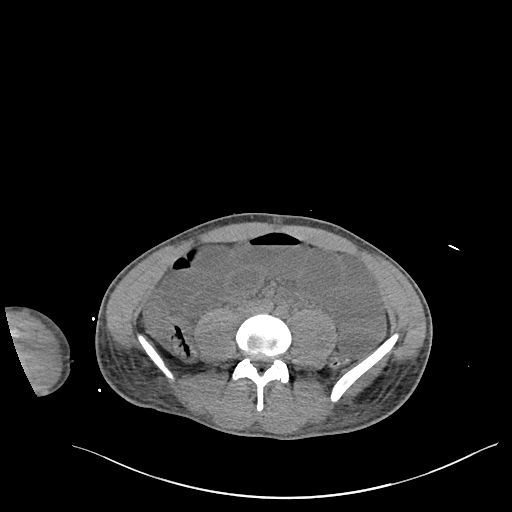
[im 134/326  soft-tissue]
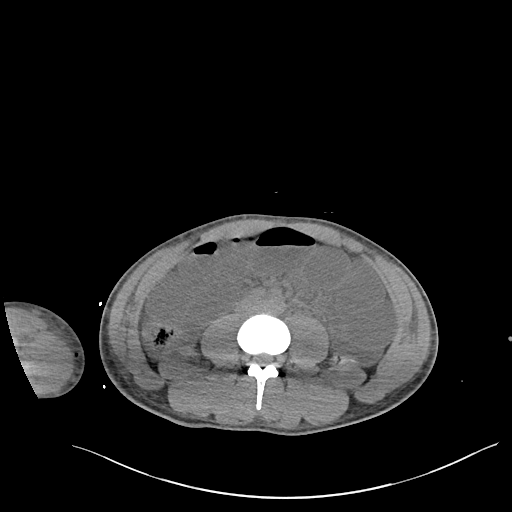
[im 173/326  soft-tissue]
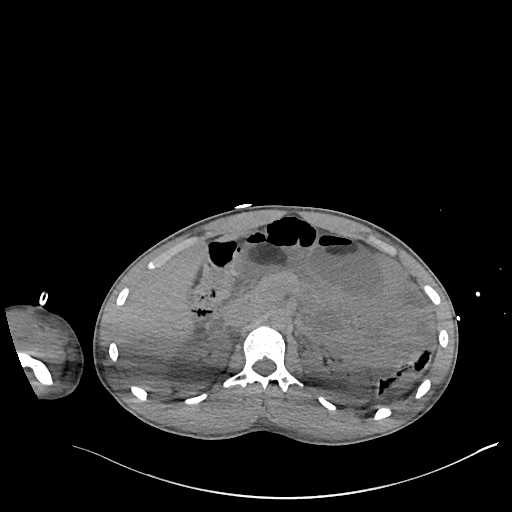
[im 192/326  soft-tissue]
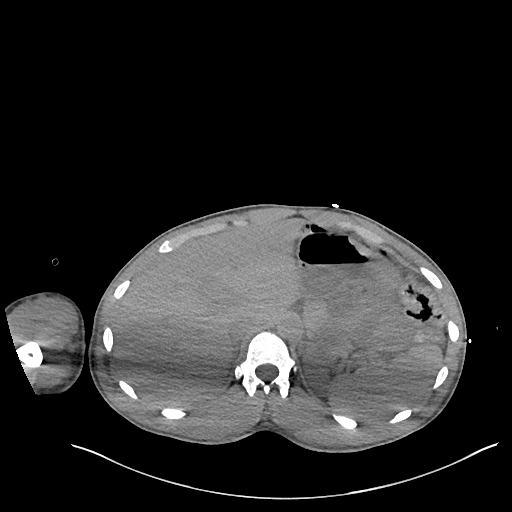
[im 211/326  soft-tissue]
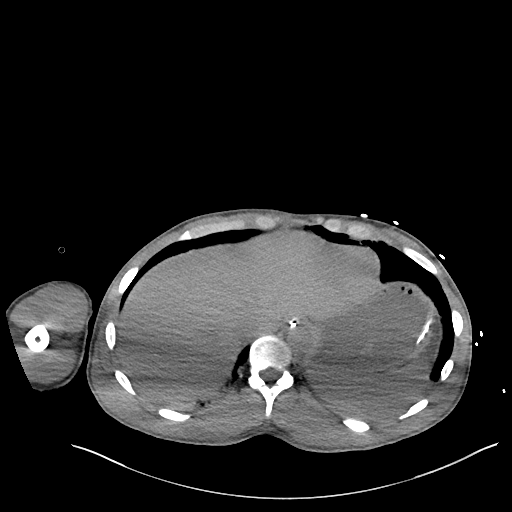
[im 249/326  soft-tissue]
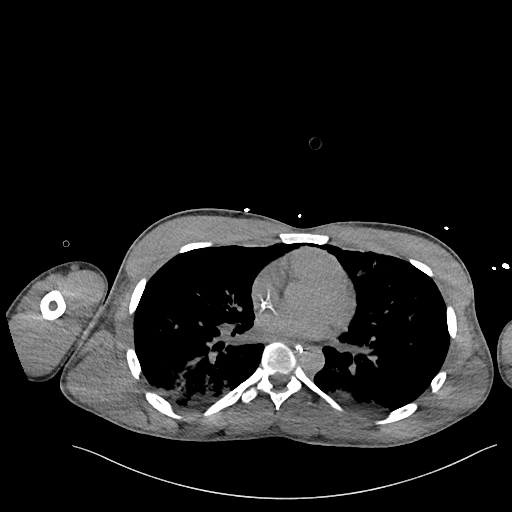
[im 249/326  bone]
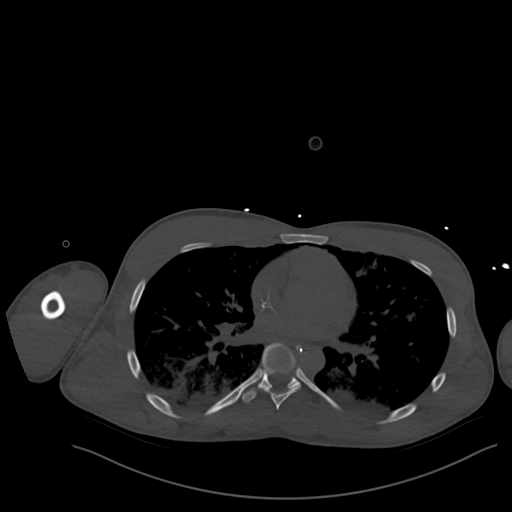
[im 268/326  soft-tissue]
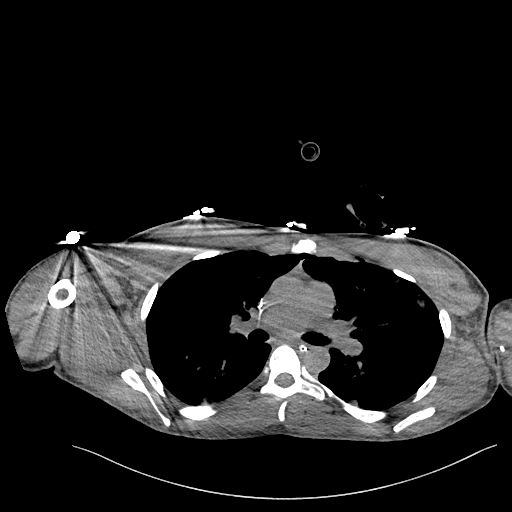
[im 306/326  soft-tissue]
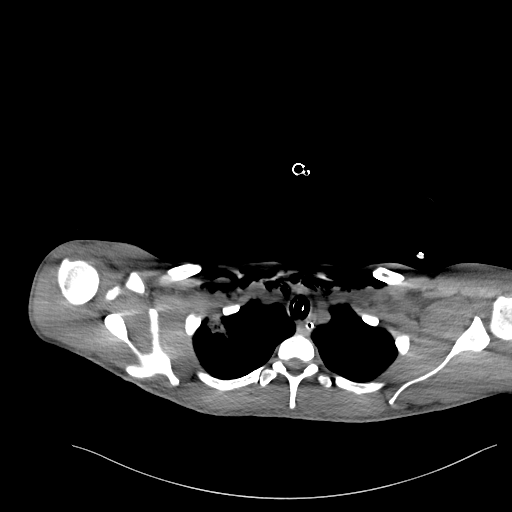

[Series 7: cap w/o 3.0 mm st cor · coronal · non-contrast · 0.76mm/px · 3 of 79 slices shown]
[im 27/79  soft-tissue]
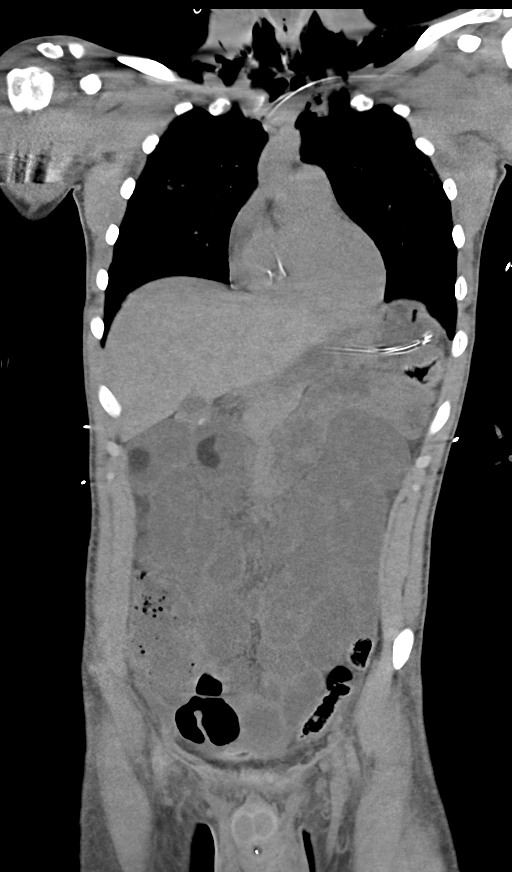
[im 35/79  soft-tissue]
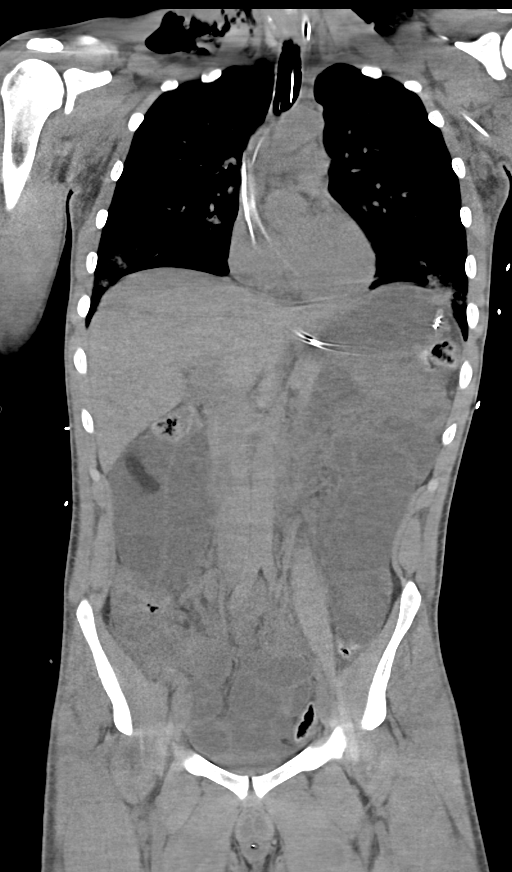
[im 44/79  soft-tissue]
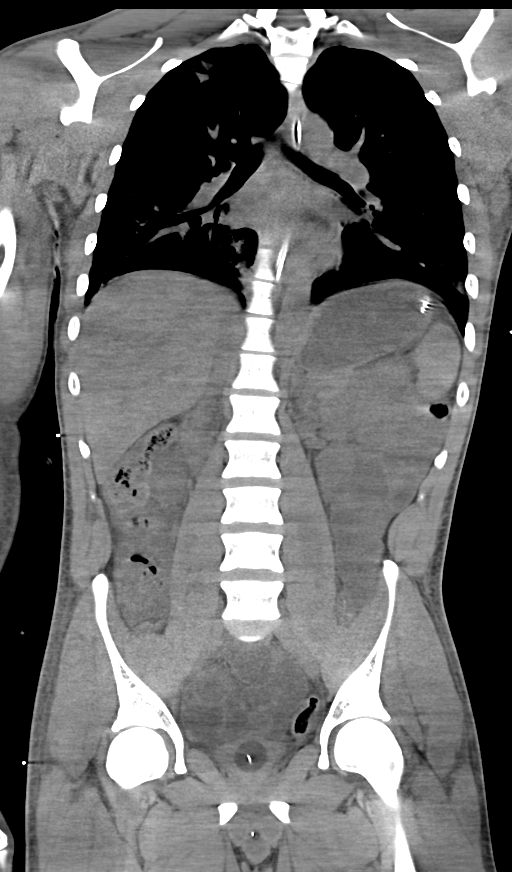

[14 of 46 positions shown; findings below may reference images not displayed]

FINDINGS: CT CHEST FINDINGS

Cardiovascular: Normal heart size. No pericardial effusion.
Left-sided PICC with tip at the right atrium.

Mediastinum/Nodes: Pneumomediastinum that is moderate. The enteric
tube traverses the esophagus. No adenopathy.

Lungs/Pleura: Bilateral airspace disease with a dependent
predilection. There is also anti dependent airspace opacities, some
with small areas of cavitation/lucency.

Musculoskeletal: Negative

CT ABDOMEN PELVIS FINDINGS

Hepatobiliary: Limited evaluation the liver without contrast and due
to streak artifact. No detectable abnormality.

Pancreas: Unremarkable. No pancreatic ductal dilatation or
surrounding inflammatory changes.

Spleen: Normal in size without focal abnormality.

Adrenals/Urinary Tract: Adrenal glands are unremarkable. Kidneys are
normal, without renal calculi, focal lesion, or hydronephrosis.
Bladder is decompressed by Foley catheter.

Stomach/Bowel: Generalized fluid-filled small bowel which is
distended. There are tubular areas of fat density within the
proximal small bowel which is presumably ingested rather than
multiple clustered lipomas.

Vascular/Lymphatic: No atherosclerotic calcification. Negative for
aneurysm

Reproductive: Negative

Other: No pneumoperitoneum

Musculoskeletal: Negative
IMPRESSION: 1. Bilateral pneumonia with dependent predilection potentially
reflecting underlying aspiration. There are also areas of cavitation
and septic emboli are considered, please correlate with temperature
or blood cultures.
2. Generalized fluid-filled small bowel from ileus or shock bowel.
3. Unusual fatty densities within the small bowel, presumably
ingested rather than clustered small bowel lipomas.
4. Pneumomediastinum and soft tissue emphysema.
